# Patient Record
Sex: Female | Born: 1935 | Race: White | Hispanic: No | Smoking: Former smoker
Health system: Southern US, Community
[De-identification: ages and names within clinical notes are randomized; demographics above are authoritative.]

## PROBLEM LIST (undated history)

## (undated) DIAGNOSIS — J189 Pneumonia, unspecified organism: Secondary | ICD-10-CM

## (undated) DIAGNOSIS — F32A Depression, unspecified: Secondary | ICD-10-CM

## (undated) DIAGNOSIS — R011 Cardiac murmur, unspecified: Secondary | ICD-10-CM

## (undated) DIAGNOSIS — I10 Essential (primary) hypertension: Secondary | ICD-10-CM

## (undated) DIAGNOSIS — Z9981 Dependence on supplemental oxygen: Secondary | ICD-10-CM

## (undated) DIAGNOSIS — F329 Major depressive disorder, single episode, unspecified: Secondary | ICD-10-CM

## (undated) DIAGNOSIS — J449 Chronic obstructive pulmonary disease, unspecified: Secondary | ICD-10-CM

## (undated) DIAGNOSIS — M069 Rheumatoid arthritis, unspecified: Secondary | ICD-10-CM

## (undated) HISTORY — PX: DILATION AND CURETTAGE OF UTERUS: SHX78

## (undated) HISTORY — PX: HAMMER TOE SURGERY: SHX385

---
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
## 1958-07-14 HISTORY — PX: EXCISIONAL HEMORRHOIDECTOMY: SHX1541

## 2011-07-22 DIAGNOSIS — Z79899 Other long term (current) drug therapy: Secondary | ICD-10-CM | POA: Diagnosis not present

## 2011-07-22 DIAGNOSIS — M069 Rheumatoid arthritis, unspecified: Secondary | ICD-10-CM | POA: Diagnosis not present

## 2011-07-24 DIAGNOSIS — Z79899 Other long term (current) drug therapy: Secondary | ICD-10-CM | POA: Diagnosis not present

## 2011-07-24 DIAGNOSIS — M0510 Rheumatoid lung disease with rheumatoid arthritis of unspecified site: Secondary | ICD-10-CM | POA: Diagnosis not present

## 2011-07-24 DIAGNOSIS — M069 Rheumatoid arthritis, unspecified: Secondary | ICD-10-CM | POA: Diagnosis not present

## 2011-08-14 DIAGNOSIS — M069 Rheumatoid arthritis, unspecified: Secondary | ICD-10-CM | POA: Diagnosis not present

## 2011-09-24 DIAGNOSIS — M069 Rheumatoid arthritis, unspecified: Secondary | ICD-10-CM | POA: Diagnosis not present

## 2011-11-05 DIAGNOSIS — M069 Rheumatoid arthritis, unspecified: Secondary | ICD-10-CM | POA: Diagnosis not present

## 2011-11-20 DIAGNOSIS — M069 Rheumatoid arthritis, unspecified: Secondary | ICD-10-CM | POA: Diagnosis not present

## 2011-11-20 DIAGNOSIS — Z79899 Other long term (current) drug therapy: Secondary | ICD-10-CM | POA: Diagnosis not present

## 2011-11-20 DIAGNOSIS — M0510 Rheumatoid lung disease with rheumatoid arthritis of unspecified site: Secondary | ICD-10-CM | POA: Diagnosis not present

## 2011-11-24 DIAGNOSIS — Z79899 Other long term (current) drug therapy: Secondary | ICD-10-CM | POA: Diagnosis not present

## 2011-11-24 DIAGNOSIS — M069 Rheumatoid arthritis, unspecified: Secondary | ICD-10-CM | POA: Diagnosis not present

## 2011-12-17 DIAGNOSIS — M069 Rheumatoid arthritis, unspecified: Secondary | ICD-10-CM | POA: Diagnosis not present

## 2012-01-28 DIAGNOSIS — M069 Rheumatoid arthritis, unspecified: Secondary | ICD-10-CM | POA: Diagnosis not present

## 2012-02-05 DIAGNOSIS — G250 Essential tremor: Secondary | ICD-10-CM | POA: Diagnosis not present

## 2012-02-05 DIAGNOSIS — J449 Chronic obstructive pulmonary disease, unspecified: Secondary | ICD-10-CM | POA: Diagnosis not present

## 2012-02-05 DIAGNOSIS — M069 Rheumatoid arthritis, unspecified: Secondary | ICD-10-CM | POA: Diagnosis not present

## 2012-02-05 DIAGNOSIS — G252 Other specified forms of tremor: Secondary | ICD-10-CM | POA: Diagnosis not present

## 2012-02-23 DIAGNOSIS — M069 Rheumatoid arthritis, unspecified: Secondary | ICD-10-CM | POA: Diagnosis not present

## 2012-02-23 DIAGNOSIS — M0510 Rheumatoid lung disease with rheumatoid arthritis of unspecified site: Secondary | ICD-10-CM | POA: Diagnosis not present

## 2012-02-23 DIAGNOSIS — Z79899 Other long term (current) drug therapy: Secondary | ICD-10-CM | POA: Diagnosis not present

## 2012-03-10 DIAGNOSIS — M069 Rheumatoid arthritis, unspecified: Secondary | ICD-10-CM | POA: Diagnosis not present

## 2012-04-08 DIAGNOSIS — R011 Cardiac murmur, unspecified: Secondary | ICD-10-CM | POA: Diagnosis not present

## 2012-04-08 DIAGNOSIS — Z23 Encounter for immunization: Secondary | ICD-10-CM | POA: Diagnosis not present

## 2012-04-08 DIAGNOSIS — M069 Rheumatoid arthritis, unspecified: Secondary | ICD-10-CM | POA: Diagnosis not present

## 2012-04-15 DIAGNOSIS — J449 Chronic obstructive pulmonary disease, unspecified: Secondary | ICD-10-CM | POA: Diagnosis not present

## 2012-04-15 DIAGNOSIS — R5383 Other fatigue: Secondary | ICD-10-CM | POA: Diagnosis not present

## 2012-04-15 DIAGNOSIS — M2550 Pain in unspecified joint: Secondary | ICD-10-CM | POA: Diagnosis not present

## 2012-04-15 DIAGNOSIS — M069 Rheumatoid arthritis, unspecified: Secondary | ICD-10-CM | POA: Diagnosis not present

## 2012-04-15 DIAGNOSIS — R5381 Other malaise: Secondary | ICD-10-CM | POA: Diagnosis not present

## 2012-05-03 DIAGNOSIS — M069 Rheumatoid arthritis, unspecified: Secondary | ICD-10-CM | POA: Diagnosis not present

## 2012-05-05 DIAGNOSIS — M2550 Pain in unspecified joint: Secondary | ICD-10-CM | POA: Diagnosis not present

## 2012-05-05 DIAGNOSIS — Z79899 Other long term (current) drug therapy: Secondary | ICD-10-CM | POA: Diagnosis not present

## 2012-05-05 DIAGNOSIS — M069 Rheumatoid arthritis, unspecified: Secondary | ICD-10-CM | POA: Diagnosis not present

## 2012-05-11 ENCOUNTER — Other Ambulatory Visit (HOSPITAL_COMMUNITY): Payer: Self-pay | Admitting: *Deleted

## 2012-05-12 ENCOUNTER — Encounter (HOSPITAL_COMMUNITY): Payer: Self-pay

## 2012-05-12 ENCOUNTER — Encounter (HOSPITAL_COMMUNITY): Admission: RE | Admit: 2012-05-12 | Payer: Medicare Other | Source: Ambulatory Visit | Admitting: Rheumatology

## 2012-05-12 ENCOUNTER — Encounter (HOSPITAL_COMMUNITY)
Admission: RE | Admit: 2012-05-12 | Discharge: 2012-05-12 | Disposition: A | Discharge status: Home / Self Care | Payer: Medicare Other | Source: Ambulatory Visit | Attending: Rheumatology | Admitting: Rheumatology

## 2012-05-12 DIAGNOSIS — M069 Rheumatoid arthritis, unspecified: Secondary | ICD-10-CM | POA: Diagnosis not present

## 2012-05-12 HISTORY — DX: Cardiac murmur, unspecified: R01.1

## 2012-05-12 HISTORY — DX: Chronic obstructive pulmonary disease, unspecified: J44.9

## 2012-05-12 HISTORY — DX: Rheumatoid arthritis, unspecified: M06.9

## 2012-05-12 HISTORY — DX: Essential (primary) hypertension: I10

## 2012-05-12 MED ORDER — ACETAMINOPHEN 500 MG PO TABS
1000.0000 mg | ORAL_TABLET | ORAL | Status: DC
  Administered 2012-05-12: 500 mg via ORAL
  Filled 2012-05-12: qty 1

## 2012-05-12 MED ORDER — SODIUM CHLORIDE 0.9 % IV SOLN
5.0000 mg/kg | INTRAVENOUS | Status: DC
  Administered 2012-05-12: 300 mg via INTRAVENOUS
  Filled 2012-05-12: qty 30

## 2012-05-12 MED ORDER — SODIUM CHLORIDE 0.9 % IV SOLN
INTRAVENOUS | Status: DC
  Administered 2012-05-12: 12:00:00 via INTRAVENOUS

## 2012-05-12 NOTE — Progress Notes (Signed)
Spoke to [employer] and she stated that patient needs to have a total of 1000 mg of Tylenol prior to transfusion. If she took 500 mg in her Percocet at home it is ok to give another 500mg  when arrives here to have a total of 1000mg  Tylenol pre infusion.

## 2012-05-12 NOTE — Progress Notes (Signed)
Patient took one Percocet at home 1-2 hours ago. She takes 7.5/500 Percocet 1 pill every 5 hours. Order for 1000mg  Tylenol premed prior to Remicade. Call placed to [facility] with Dr. Nickola Major to clarify Tylenol dosage for premed as patient has had 500mg  prior to coming.

## 2012-06-23 ENCOUNTER — Encounter (HOSPITAL_COMMUNITY)
Admission: RE | Admit: 2012-06-23 | Discharge: 2012-06-23 | Disposition: A | Discharge status: Home / Self Care | Payer: Medicare Other | Source: Ambulatory Visit | Attending: Rheumatology | Admitting: Rheumatology

## 2012-06-23 ENCOUNTER — Encounter (HOSPITAL_COMMUNITY): Payer: Self-pay

## 2012-06-23 DIAGNOSIS — M069 Rheumatoid arthritis, unspecified: Secondary | ICD-10-CM | POA: Diagnosis not present

## 2012-06-23 MED ORDER — SODIUM CHLORIDE 0.9 % IV SOLN
Freq: Once | INTRAVENOUS | Status: AC
  Administered 2012-06-23: 250 mL via INTRAVENOUS

## 2012-06-23 MED ORDER — SODIUM CHLORIDE 0.9 % IV SOLN
5.0000 mg/kg | Freq: Once | INTRAVENOUS | Status: AC
  Administered 2012-06-23: 300 mg via INTRAVENOUS
  Filled 2012-06-23: qty 30

## 2012-06-23 NOTE — Progress Notes (Signed)
declines tylenol as she has had 2 percocet this am

## 2012-06-25 DIAGNOSIS — Z79899 Other long term (current) drug therapy: Secondary | ICD-10-CM | POA: Diagnosis not present

## 2012-06-25 DIAGNOSIS — G479 Sleep disorder, unspecified: Secondary | ICD-10-CM | POA: Diagnosis not present

## 2012-06-25 DIAGNOSIS — J449 Chronic obstructive pulmonary disease, unspecified: Secondary | ICD-10-CM | POA: Diagnosis not present

## 2012-06-25 DIAGNOSIS — J329 Chronic sinusitis, unspecified: Secondary | ICD-10-CM | POA: Diagnosis not present

## 2012-06-25 DIAGNOSIS — Z0000 Encounter for general adult medical examination without abnormal findings: Secondary | ICD-10-CM | POA: Diagnosis not present

## 2012-06-25 DIAGNOSIS — I10 Essential (primary) hypertension: Secondary | ICD-10-CM | POA: Diagnosis not present

## 2012-07-15 DIAGNOSIS — M2550 Pain in unspecified joint: Secondary | ICD-10-CM | POA: Diagnosis not present

## 2012-07-15 DIAGNOSIS — M069 Rheumatoid arthritis, unspecified: Secondary | ICD-10-CM | POA: Diagnosis not present

## 2012-07-15 DIAGNOSIS — Z79899 Other long term (current) drug therapy: Secondary | ICD-10-CM | POA: Diagnosis not present

## 2012-08-04 ENCOUNTER — Encounter (HOSPITAL_COMMUNITY): Payer: Self-pay

## 2012-08-04 ENCOUNTER — Encounter (HOSPITAL_COMMUNITY)
Admission: RE | Admit: 2012-08-04 | Discharge: 2012-08-04 | Disposition: A | Discharge status: Home / Self Care | Payer: Medicare Other | Source: Ambulatory Visit | Attending: Rheumatology | Admitting: Rheumatology

## 2012-08-04 DIAGNOSIS — M069 Rheumatoid arthritis, unspecified: Secondary | ICD-10-CM | POA: Diagnosis not present

## 2012-08-04 MED ORDER — SODIUM CHLORIDE 0.9 % IV SOLN
500.0000 mg | INTRAVENOUS | Status: DC
  Administered 2012-08-04: 500 mg via INTRAVENOUS
  Filled 2012-08-04: qty 50

## 2012-08-04 MED ORDER — SODIUM CHLORIDE 0.9 % IV SOLN
INTRAVENOUS | Status: DC
  Administered 2012-08-04: 11:00:00 via INTRAVENOUS

## 2012-08-04 MED ORDER — ACETAMINOPHEN 500 MG PO TABS
1000.0000 mg | ORAL_TABLET | ORAL | Status: DC
  Administered 2012-08-04: 1000 mg via ORAL
  Filled 2012-08-04: qty 2

## 2012-09-15 ENCOUNTER — Encounter (HOSPITAL_COMMUNITY): Payer: Medicare Other

## 2012-09-16 ENCOUNTER — Encounter (HOSPITAL_COMMUNITY): Payer: Self-pay

## 2012-09-16 ENCOUNTER — Encounter (HOSPITAL_COMMUNITY)
Admission: RE | Admit: 2012-09-16 | Discharge: 2012-09-16 | Disposition: A | Discharge status: Home / Self Care | Payer: Medicare Other | Source: Ambulatory Visit | Attending: Rheumatology | Admitting: Rheumatology

## 2012-09-16 DIAGNOSIS — M069 Rheumatoid arthritis, unspecified: Secondary | ICD-10-CM | POA: Insufficient documentation

## 2012-09-16 MED ORDER — ACETAMINOPHEN 500 MG PO TABS
1000.0000 mg | ORAL_TABLET | ORAL | Status: DC
  Administered 2012-09-16: 1000 mg via ORAL
  Filled 2012-09-16: qty 2

## 2012-09-16 MED ORDER — SODIUM CHLORIDE 0.9 % IV SOLN: INTRAVENOUS | Status: DC

## 2012-09-16 MED ORDER — SODIUM CHLORIDE 0.9 % IV SOLN
500.0000 mg | INTRAVENOUS | Status: DC
  Administered 2012-09-16: 500 mg via INTRAVENOUS
  Filled 2012-09-16: qty 50

## 2012-09-27 DIAGNOSIS — M069 Rheumatoid arthritis, unspecified: Secondary | ICD-10-CM | POA: Diagnosis not present

## 2012-09-27 DIAGNOSIS — Z79899 Other long term (current) drug therapy: Secondary | ICD-10-CM | POA: Diagnosis not present

## 2012-09-27 DIAGNOSIS — M2550 Pain in unspecified joint: Secondary | ICD-10-CM | POA: Diagnosis not present

## 2012-10-28 ENCOUNTER — Encounter (HOSPITAL_COMMUNITY)
Admission: RE | Admit: 2012-10-28 | Discharge: 2012-10-28 | Disposition: A | Discharge status: Home / Self Care | Payer: Medicare Other | Source: Ambulatory Visit | Attending: Rheumatology | Admitting: Rheumatology

## 2012-10-28 ENCOUNTER — Encounter (HOSPITAL_COMMUNITY): Payer: Self-pay

## 2012-10-28 DIAGNOSIS — M069 Rheumatoid arthritis, unspecified: Secondary | ICD-10-CM | POA: Insufficient documentation

## 2012-10-28 MED ORDER — ACETAMINOPHEN 500 MG PO TABS
1000.0000 mg | ORAL_TABLET | ORAL | Status: DC
  Administered 2012-10-28: 1000 mg via ORAL
  Filled 2012-10-28: qty 2

## 2012-10-28 MED ORDER — SODIUM CHLORIDE 0.9 % IV SOLN
8.0000 mg/kg | INTRAVENOUS | Status: DC
  Administered 2012-10-28: 500 mg via INTRAVENOUS
  Filled 2012-10-28: qty 50

## 2012-10-28 MED ORDER — SODIUM CHLORIDE 0.9 % IV SOLN
INTRAVENOUS | Status: DC
  Administered 2012-10-28: 20 mL/h via INTRAVENOUS

## 2012-11-19 DIAGNOSIS — J449 Chronic obstructive pulmonary disease, unspecified: Secondary | ICD-10-CM | POA: Diagnosis not present

## 2012-11-19 DIAGNOSIS — R5383 Other fatigue: Secondary | ICD-10-CM | POA: Diagnosis not present

## 2012-11-19 DIAGNOSIS — I10 Essential (primary) hypertension: Secondary | ICD-10-CM | POA: Diagnosis not present

## 2012-11-19 DIAGNOSIS — R011 Cardiac murmur, unspecified: Secondary | ICD-10-CM | POA: Diagnosis not present

## 2012-11-19 DIAGNOSIS — R5381 Other malaise: Secondary | ICD-10-CM | POA: Diagnosis not present

## 2012-12-09 ENCOUNTER — Encounter (HOSPITAL_COMMUNITY): Payer: Medicare Other

## 2012-12-10 DIAGNOSIS — Z0389 Encounter for observation for other suspected diseases and conditions ruled out: Secondary | ICD-10-CM | POA: Diagnosis not present

## 2012-12-10 DIAGNOSIS — R011 Cardiac murmur, unspecified: Secondary | ICD-10-CM | POA: Diagnosis not present

## 2012-12-10 DIAGNOSIS — R5381 Other malaise: Secondary | ICD-10-CM | POA: Diagnosis not present

## 2012-12-10 DIAGNOSIS — R5383 Other fatigue: Secondary | ICD-10-CM | POA: Diagnosis not present

## 2012-12-13 ENCOUNTER — Encounter (HOSPITAL_COMMUNITY): Payer: Self-pay

## 2012-12-13 ENCOUNTER — Encounter (HOSPITAL_COMMUNITY)
Admission: RE | Admit: 2012-12-13 | Discharge: 2012-12-13 | Disposition: A | Discharge status: Home / Self Care | Payer: Medicare Other | Source: Ambulatory Visit | Attending: Rheumatology | Admitting: Rheumatology

## 2012-12-13 DIAGNOSIS — M069 Rheumatoid arthritis, unspecified: Secondary | ICD-10-CM | POA: Diagnosis not present

## 2012-12-13 MED ORDER — ACETAMINOPHEN 500 MG PO TABS
1000.0000 mg | ORAL_TABLET | ORAL | Status: DC
  Administered 2012-12-13: 1000 mg via ORAL
  Filled 2012-12-13: qty 2

## 2012-12-13 MED ORDER — SODIUM CHLORIDE 0.9 % IV SOLN
8.0000 mg/kg | INTRAVENOUS | Status: DC
  Administered 2012-12-13: 500 mg via INTRAVENOUS
  Filled 2012-12-13: qty 50

## 2012-12-13 MED ORDER — SODIUM CHLORIDE 0.9 % IV SOLN
INTRAVENOUS | Status: DC
  Administered 2012-12-13: 11:00:00 via INTRAVENOUS

## 2013-01-03 DIAGNOSIS — Z0389 Encounter for observation for other suspected diseases and conditions ruled out: Secondary | ICD-10-CM | POA: Diagnosis not present

## 2013-01-03 DIAGNOSIS — R011 Cardiac murmur, unspecified: Secondary | ICD-10-CM | POA: Diagnosis not present

## 2013-01-03 DIAGNOSIS — R5381 Other malaise: Secondary | ICD-10-CM | POA: Diagnosis not present

## 2013-01-03 DIAGNOSIS — R5383 Other fatigue: Secondary | ICD-10-CM | POA: Diagnosis not present

## 2013-01-24 ENCOUNTER — Encounter (HOSPITAL_COMMUNITY)
Admission: RE | Admit: 2013-01-24 | Discharge: 2013-01-24 | Disposition: A | Discharge status: Home / Self Care | Payer: Medicare Other | Source: Ambulatory Visit | Attending: Rheumatology | Admitting: Rheumatology

## 2013-01-24 ENCOUNTER — Encounter (HOSPITAL_COMMUNITY): Payer: Self-pay

## 2013-01-24 DIAGNOSIS — M069 Rheumatoid arthritis, unspecified: Secondary | ICD-10-CM | POA: Diagnosis not present

## 2013-01-24 MED ORDER — SODIUM CHLORIDE 0.9 % IV SOLN
INTRAVENOUS | Status: AC
  Administered 2013-01-24: 11:00:00 via INTRAVENOUS

## 2013-01-24 MED ORDER — ACETAMINOPHEN 500 MG PO TABS
1000.0000 mg | ORAL_TABLET | ORAL | Status: AC
  Administered 2013-01-24: 1000 mg via ORAL
  Filled 2013-01-24: qty 2

## 2013-01-24 MED ORDER — SODIUM CHLORIDE 0.9 % IV SOLN
8.0000 mg/kg | INTRAVENOUS | Status: AC
  Administered 2013-01-24: 500 mg via INTRAVENOUS
  Filled 2013-01-24: qty 50

## 2013-03-01 DIAGNOSIS — M069 Rheumatoid arthritis, unspecified: Secondary | ICD-10-CM | POA: Diagnosis not present

## 2013-03-01 DIAGNOSIS — M2550 Pain in unspecified joint: Secondary | ICD-10-CM | POA: Diagnosis not present

## 2013-03-01 DIAGNOSIS — Z79899 Other long term (current) drug therapy: Secondary | ICD-10-CM | POA: Diagnosis not present

## 2013-03-07 ENCOUNTER — Other Ambulatory Visit (HOSPITAL_COMMUNITY): Payer: Self-pay | Admitting: Rheumatology

## 2013-03-07 ENCOUNTER — Ambulatory Visit (HOSPITAL_COMMUNITY)
Admission: RE | Admit: 2013-03-07 | Discharge: 2013-03-07 | Disposition: A | Discharge status: Home / Self Care | Payer: Medicare Other | Source: Ambulatory Visit | Attending: Rheumatology | Admitting: Rheumatology

## 2013-03-07 ENCOUNTER — Encounter (HOSPITAL_COMMUNITY): Payer: Self-pay

## 2013-03-07 DIAGNOSIS — M069 Rheumatoid arthritis, unspecified: Secondary | ICD-10-CM | POA: Diagnosis not present

## 2013-03-07 MED ORDER — SODIUM CHLORIDE 0.9 % IV SOLN
10.0000 mg/kg | INTRAVENOUS | Status: DC
  Administered 2013-03-07: 700 mg via INTRAVENOUS
  Filled 2013-03-07: qty 70

## 2013-03-07 MED ORDER — SODIUM CHLORIDE 0.9 % IV SOLN
INTRAVENOUS | Status: DC
  Administered 2013-03-07: 11:00:00 via INTRAVENOUS

## 2013-04-18 ENCOUNTER — Encounter (HOSPITAL_COMMUNITY)
Admission: RE | Admit: 2013-04-18 | Discharge: 2013-04-18 | Disposition: A | Discharge status: Home / Self Care | Payer: Medicare Other | Source: Ambulatory Visit | Attending: Rheumatology | Admitting: Rheumatology

## 2013-04-18 ENCOUNTER — Other Ambulatory Visit (HOSPITAL_COMMUNITY): Payer: Self-pay | Admitting: Rheumatology

## 2013-04-18 DIAGNOSIS — L089 Local infection of the skin and subcutaneous tissue, unspecified: Secondary | ICD-10-CM | POA: Diagnosis not present

## 2013-04-18 DIAGNOSIS — M069 Rheumatoid arthritis, unspecified: Secondary | ICD-10-CM | POA: Insufficient documentation

## 2013-04-18 DIAGNOSIS — L290 Pruritus ani: Secondary | ICD-10-CM | POA: Diagnosis not present

## 2013-04-18 NOTE — Progress Notes (Signed)
Pt informed of dr Nickola Major orders and pt left without treatment  Apt rescheduled

## 2013-04-18 NOTE — Progress Notes (Signed)
Pt arived to short stay c open weeping wounds rt  Fourth and fifth fingers, dr Nickola Major notified  Order to not infuse today. Reschedule for 1 week if  Wounds have healed  Also inform to to see her md about this.  Pt notified

## 2013-04-25 ENCOUNTER — Encounter (HOSPITAL_COMMUNITY): Payer: Medicare Other

## 2013-04-25 DIAGNOSIS — M24549 Contracture, unspecified hand: Secondary | ICD-10-CM | POA: Diagnosis not present

## 2013-04-25 DIAGNOSIS — L02519 Cutaneous abscess of unspecified hand: Secondary | ICD-10-CM | POA: Diagnosis not present

## 2013-04-25 NOTE — Progress Notes (Signed)
Daughter, Dewayne Hatch called to cancel pt's 04/26/13 appt.   States pt has a hand infection and has been referred to a hand specialist.  Pt still to see Dr. Lendon Colonel also.

## 2013-04-26 ENCOUNTER — Encounter (HOSPITAL_COMMUNITY): Admission: RE | Admit: 2013-04-26 | Payer: Medicare Other | Source: Ambulatory Visit

## 2013-04-26 DIAGNOSIS — T148XXA Other injury of unspecified body region, initial encounter: Secondary | ICD-10-CM | POA: Diagnosis not present

## 2013-05-03 DIAGNOSIS — T148XXA Other injury of unspecified body region, initial encounter: Secondary | ICD-10-CM | POA: Diagnosis not present

## 2013-05-10 ENCOUNTER — Encounter (HOSPITAL_COMMUNITY): Payer: Self-pay

## 2013-05-10 ENCOUNTER — Encounter (HOSPITAL_COMMUNITY)
Admission: RE | Admit: 2013-05-10 | Discharge: 2013-05-10 | Disposition: A | Discharge status: Home / Self Care | Payer: Medicare Other | Source: Ambulatory Visit | Attending: Rheumatology | Admitting: Rheumatology

## 2013-05-10 DIAGNOSIS — M069 Rheumatoid arthritis, unspecified: Secondary | ICD-10-CM | POA: Diagnosis not present

## 2013-05-10 MED ORDER — ACETAMINOPHEN 325 MG PO TABS: 650.0000 mg | ORAL_TABLET | ORAL | Status: DC | PRN

## 2013-05-10 MED ORDER — DIPHENHYDRAMINE HCL 25 MG PO TABS
25.0000 mg | ORAL_TABLET | ORAL | Status: DC | PRN
  Administered 2013-05-10: 25 mg via ORAL
  Filled 2013-05-10 (×2): qty 1

## 2013-05-10 MED ORDER — SODIUM CHLORIDE 0.9 % IV SOLN
10.0000 mg/kg | INTRAVENOUS | Status: DC
  Administered 2013-05-10: 700 mg via INTRAVENOUS
  Filled 2013-05-10: qty 70

## 2013-05-10 MED ORDER — SODIUM CHLORIDE 0.9 % IV SOLN
INTRAVENOUS | Status: DC
  Administered 2013-05-10: 250 mL via INTRAVENOUS

## 2013-05-10 MED ORDER — RANITIDINE HCL 150 MG/10ML PO SYRP: 150.0000 mg | ORAL_SOLUTION | ORAL | Status: DC | PRN

## 2013-06-07 DIAGNOSIS — M2550 Pain in unspecified joint: Secondary | ICD-10-CM | POA: Diagnosis not present

## 2013-06-07 DIAGNOSIS — Z23 Encounter for immunization: Secondary | ICD-10-CM | POA: Diagnosis not present

## 2013-06-07 DIAGNOSIS — B379 Candidiasis, unspecified: Secondary | ICD-10-CM | POA: Diagnosis not present

## 2013-06-07 DIAGNOSIS — Z79899 Other long term (current) drug therapy: Secondary | ICD-10-CM | POA: Diagnosis not present

## 2013-06-07 DIAGNOSIS — M069 Rheumatoid arthritis, unspecified: Secondary | ICD-10-CM | POA: Diagnosis not present

## 2013-06-07 DIAGNOSIS — R300 Dysuria: Secondary | ICD-10-CM | POA: Diagnosis not present

## 2013-06-08 DIAGNOSIS — M069 Rheumatoid arthritis, unspecified: Secondary | ICD-10-CM | POA: Diagnosis not present

## 2013-06-08 DIAGNOSIS — R300 Dysuria: Secondary | ICD-10-CM | POA: Diagnosis not present

## 2013-06-21 ENCOUNTER — Other Ambulatory Visit (HOSPITAL_COMMUNITY): Payer: Self-pay | Admitting: Rheumatology

## 2013-06-21 ENCOUNTER — Encounter (HOSPITAL_COMMUNITY)
Admission: RE | Admit: 2013-06-21 | Discharge: 2013-06-21 | Disposition: A | Discharge status: Home / Self Care | Payer: Medicare Other | Source: Ambulatory Visit | Attending: Rheumatology | Admitting: Rheumatology

## 2013-06-21 DIAGNOSIS — M069 Rheumatoid arthritis, unspecified: Secondary | ICD-10-CM | POA: Insufficient documentation

## 2013-06-27 DIAGNOSIS — B029 Zoster without complications: Secondary | ICD-10-CM | POA: Diagnosis not present

## 2013-06-27 DIAGNOSIS — I10 Essential (primary) hypertension: Secondary | ICD-10-CM | POA: Diagnosis not present

## 2013-07-21 ENCOUNTER — Other Ambulatory Visit: Payer: Self-pay | Admitting: Internal Medicine

## 2013-07-21 ENCOUNTER — Ambulatory Visit
Admission: RE | Admit: 2013-07-21 | Discharge: 2013-07-21 | Disposition: A | Discharge status: Home / Self Care | Payer: Medicare Other | Source: Ambulatory Visit | Attending: Internal Medicine | Admitting: Internal Medicine

## 2013-07-21 DIAGNOSIS — R110 Nausea: Secondary | ICD-10-CM | POA: Diagnosis not present

## 2013-07-21 DIAGNOSIS — M792 Neuralgia and neuritis, unspecified: Secondary | ICD-10-CM

## 2013-07-21 DIAGNOSIS — IMO0002 Reserved for concepts with insufficient information to code with codable children: Secondary | ICD-10-CM | POA: Diagnosis not present

## 2013-07-21 DIAGNOSIS — R072 Precordial pain: Secondary | ICD-10-CM | POA: Diagnosis not present

## 2013-07-25 DIAGNOSIS — M069 Rheumatoid arthritis, unspecified: Secondary | ICD-10-CM | POA: Diagnosis not present

## 2013-09-07 DIAGNOSIS — M069 Rheumatoid arthritis, unspecified: Secondary | ICD-10-CM | POA: Diagnosis not present

## 2013-09-20 DIAGNOSIS — R300 Dysuria: Secondary | ICD-10-CM | POA: Diagnosis not present

## 2013-09-20 DIAGNOSIS — IMO0002 Reserved for concepts with insufficient information to code with codable children: Secondary | ICD-10-CM | POA: Diagnosis not present

## 2013-10-05 DIAGNOSIS — M2550 Pain in unspecified joint: Secondary | ICD-10-CM | POA: Diagnosis not present

## 2013-10-05 DIAGNOSIS — Z79899 Other long term (current) drug therapy: Secondary | ICD-10-CM | POA: Diagnosis not present

## 2013-10-05 DIAGNOSIS — M069 Rheumatoid arthritis, unspecified: Secondary | ICD-10-CM | POA: Diagnosis not present

## 2013-10-05 DIAGNOSIS — B029 Zoster without complications: Secondary | ICD-10-CM | POA: Diagnosis not present

## 2013-10-25 DIAGNOSIS — B029 Zoster without complications: Secondary | ICD-10-CM | POA: Diagnosis not present

## 2013-10-25 DIAGNOSIS — I10 Essential (primary) hypertension: Secondary | ICD-10-CM | POA: Diagnosis not present

## 2013-10-25 DIAGNOSIS — N952 Postmenopausal atrophic vaginitis: Secondary | ICD-10-CM | POA: Diagnosis not present

## 2013-11-01 DIAGNOSIS — M069 Rheumatoid arthritis, unspecified: Secondary | ICD-10-CM | POA: Diagnosis not present

## 2013-12-26 DIAGNOSIS — M069 Rheumatoid arthritis, unspecified: Secondary | ICD-10-CM | POA: Diagnosis not present

## 2014-01-03 DIAGNOSIS — R3915 Urgency of urination: Secondary | ICD-10-CM | POA: Diagnosis not present

## 2014-01-03 DIAGNOSIS — N9089 Other specified noninflammatory disorders of vulva and perineum: Secondary | ICD-10-CM | POA: Diagnosis not present

## 2014-01-23 DIAGNOSIS — Z1331 Encounter for screening for depression: Secondary | ICD-10-CM | POA: Diagnosis not present

## 2014-01-23 DIAGNOSIS — I10 Essential (primary) hypertension: Secondary | ICD-10-CM | POA: Diagnosis not present

## 2014-01-23 DIAGNOSIS — Z23 Encounter for immunization: Secondary | ICD-10-CM | POA: Diagnosis not present

## 2014-02-21 DIAGNOSIS — M069 Rheumatoid arthritis, unspecified: Secondary | ICD-10-CM | POA: Diagnosis not present

## 2014-04-17 DIAGNOSIS — M0589 Other rheumatoid arthritis with rheumatoid factor of multiple sites: Secondary | ICD-10-CM | POA: Diagnosis not present

## 2014-06-12 DIAGNOSIS — M0589 Other rheumatoid arthritis with rheumatoid factor of multiple sites: Secondary | ICD-10-CM | POA: Diagnosis not present

## 2014-06-19 ENCOUNTER — Other Ambulatory Visit: Payer: Self-pay | Admitting: Geriatric Medicine

## 2014-06-19 ENCOUNTER — Ambulatory Visit
Admission: RE | Admit: 2014-06-19 | Discharge: 2014-06-19 | Disposition: A | Discharge status: Home / Self Care | Payer: Medicare Other | Source: Ambulatory Visit | Attending: Geriatric Medicine | Admitting: Geriatric Medicine

## 2014-06-19 DIAGNOSIS — R0781 Pleurodynia: Secondary | ICD-10-CM

## 2014-06-19 DIAGNOSIS — W1809XA Striking against other object with subsequent fall, initial encounter: Secondary | ICD-10-CM | POA: Diagnosis not present

## 2014-06-19 DIAGNOSIS — S299XXA Unspecified injury of thorax, initial encounter: Secondary | ICD-10-CM | POA: Diagnosis not present

## 2014-07-31 DIAGNOSIS — R5383 Other fatigue: Secondary | ICD-10-CM | POA: Diagnosis not present

## 2014-07-31 DIAGNOSIS — F329 Major depressive disorder, single episode, unspecified: Secondary | ICD-10-CM | POA: Diagnosis not present

## 2014-07-31 DIAGNOSIS — I10 Essential (primary) hypertension: Secondary | ICD-10-CM | POA: Diagnosis not present

## 2014-07-31 DIAGNOSIS — M069 Rheumatoid arthritis, unspecified: Secondary | ICD-10-CM | POA: Diagnosis not present

## 2014-07-31 DIAGNOSIS — Z0000 Encounter for general adult medical examination without abnormal findings: Secondary | ICD-10-CM | POA: Diagnosis not present

## 2014-07-31 DIAGNOSIS — L84 Corns and callosities: Secondary | ICD-10-CM | POA: Diagnosis not present

## 2014-08-07 DIAGNOSIS — M0589 Other rheumatoid arthritis with rheumatoid factor of multiple sites: Secondary | ICD-10-CM | POA: Diagnosis not present

## 2014-08-14 ENCOUNTER — Ambulatory Visit: Payer: Self-pay | Admitting: Podiatry

## 2014-08-21 DIAGNOSIS — F329 Major depressive disorder, single episode, unspecified: Secondary | ICD-10-CM | POA: Diagnosis not present

## 2014-08-21 DIAGNOSIS — J309 Allergic rhinitis, unspecified: Secondary | ICD-10-CM | POA: Diagnosis not present

## 2014-08-30 DIAGNOSIS — M0589 Other rheumatoid arthritis with rheumatoid factor of multiple sites: Secondary | ICD-10-CM | POA: Diagnosis not present

## 2014-08-30 DIAGNOSIS — M2550 Pain in unspecified joint: Secondary | ICD-10-CM | POA: Diagnosis not present

## 2014-08-30 DIAGNOSIS — Z79899 Other long term (current) drug therapy: Secondary | ICD-10-CM | POA: Diagnosis not present

## 2014-09-04 ENCOUNTER — Ambulatory Visit: Payer: Medicare Other | Admitting: Podiatry

## 2014-09-21 DIAGNOSIS — M0589 Other rheumatoid arthritis with rheumatoid factor of multiple sites: Secondary | ICD-10-CM | POA: Diagnosis not present

## 2014-09-25 ENCOUNTER — Ambulatory Visit (INDEPENDENT_AMBULATORY_CARE_PROVIDER_SITE_OTHER): Payer: Medicare Other | Admitting: Podiatry

## 2014-09-25 ENCOUNTER — Encounter: Payer: Self-pay | Admitting: Podiatry

## 2014-09-25 VITALS — BP 121/64 | HR 74 | Resp 12

## 2014-09-25 DIAGNOSIS — M06871 Other specified rheumatoid arthritis, right ankle and foot: Secondary | ICD-10-CM

## 2014-09-25 NOTE — Patient Instructions (Signed)
Wear gelcap toe sleeve on third or fourth right toe as needed  Rheumatoid Arthritis Rheumatoid arthritis is a long-term (chronic) inflammatory disease that causes pain, swelling, and stiffness of the joints. It can affect the entire body, including the eyes and lungs. The effects of rheumatoid arthritis vary widely among those with the condition. CAUSES  The cause of rheumatoid arthritis is not known. It tends to run in families and is more common in women. Certain cells of the body's natural defense system (immune system) do not work properly and begin to attack healthy joints. It primarily involves the connective tissue that lines the joints (synovial membrane). This can cause damage to the joint. SYMPTOMS   Pain, stiffness, swelling, and decreased motion of many joints, especially in the hands and feet.  Stiffness that is worse in the morning. It may last 1-2 hours or longer.  Numbness and tingling in the hands.  Fatigue.  Loss of appetite.  Weight loss.  Low-grade fever.  Dry eyes and mouth.  Firm lumps (rheumatoid nodules) that grow beneath the skin in areas such as the elbows and hands. DIAGNOSIS  Diagnosis is based on the symptoms described, an exam, and blood tests. Sometimes, X-rays are helpful. TREATMENT  The goals of treatment are to relieve pain, reduce inflammation, and to slow down or stop joint damage and disability. Methods vary and may include:  Maintaining a balance of rest, exercise, and proper nutrition.  Medicines:  Pain relievers (analgesics).  Corticosteroids and nonsteroidal anti-inflammatory drugs (NSAIDs) to reduce inflammation.  Disease-modifying antirheumatic drugs (DMARDs) to try to slow the course of the disease.  Biologic response modifiers to reduce inflammation and damage.  Physical therapy and occupational therapy.  Surgery for patients with severe joint damage. Joint replacement or fusing of joints may be needed.  Routine monitoring and  ongoing care, such as office visits, blood and urine tests, and X-rays. HOME CARE INSTRUCTIONS   Remain physically active and reduce activity when the disease gets worse.  Eat a well-balanced diet.  Put heat on affected joints when you wake up and before activities. Keep the heat on the affected joint for as long as directed by your health care provider.  Put ice on affected joints following activities or exercising.  Put ice in a plastic bag.  Place a towel between your skin and the bag.  Leave the ice on for 15-20 minutes, 3-4 times per day, or as directed by your health care provider.  Take medicines and supplements only as directed by your health care provider.  Use splints as directed by your health care provider. Splints help maintain joint position and function.  Do not sleep with pillows under your knees. This may lead to spasms.  Participate in a self-management program to keep current with the latest treatment and coping skills. SEEK IMMEDIATE MEDICAL CARE IF:  You have fainting episodes.  You have periods of extreme weakness.  You rapidly develop a hot, painful joint that is more severe than usual joint aches.  You have chills.  You have a fever. FOR MORE INFORMATION   American College of Rheumatology: www.rheumatology.org  Arthritis Foundation: www.arthritis.org Document Released: 06/27/2000 Document Revised: 11/14/2013 Document Reviewed: 08/06/2011 [facility] Patient Information 2015 [place], [place]. This information is not intended to replace advice given to you by your health care provider. Make sure you discuss any questions you have with your health care provider.

## 2014-09-25 NOTE — Progress Notes (Signed)
   Subjective:    Patient ID: Alexis Andrews, female    DOB: Jan 25, 1927, 81 y.o.   MRN: [id number]  HPI  N-DISCOLORATION L-B/L TOENAILS  D-LONG-TERM O-SLOWLY C-LONGER A-N/A T- N/A  ALSO, CHECK RT FOOT 3RD TOE HAVE CORN. The patient's daughter (who is a Engineer, civil (consulting)) self treated corn on third right toe with over-the-counter corn plaster and resolved the corn  Patient is history of rheumatoid arthritis  Review of Systems  Musculoskeletal: Positive for gait problem.  All other systems reviewed and are negative.      Objective:   Physical Exam  Orientated 3 patient presents with daughter present in room  Vascular: DP pulses 2/4 bilaterally PT pulses 2/4 bilaterally  Neurological: Sensation to 10 g monofilament wire intact 5/5 bilaterally Vibratory sensation nonreactive left and reactive right Ankle reflex equal reactive bilaterally  Dermatological: Atrophic skin without any hair growth bilaterally Well-healed surgical scars dorsal medial first MPJ, second, third toes MPJs Nails are neatly trimmed without any deformity and occasional color changes  Musculoskeletal: Limited range of motion right first MPJ Limited range of motion 1-4 left MPJ Transverse medial plain positioning toes 2-4 bilaterally There is no restriction ankle or subtalar or midtarsal joints bilaterally        Assessment & Plan:   Assessment: Satisfactory vascular status bilaterally Mild peripheral neuropathy bilaterally Surgical treatment forrheumatoid arthritis, right foot Rheumatoid changes forefoot left (no history of surgical treatment left foot) Friction rub against the third right toe with a history of corn The toenails are non-dystrophic and need no treatment  Plan: Reviewed results of examination today with patient and daughter Advised against the use of corn medicine on the inner surface of the toes Dispensed gelcap to wear over third right toe as needed  Reappoint at patient's  request

## 2014-10-17 DIAGNOSIS — M0589 Other rheumatoid arthritis with rheumatoid factor of multiple sites: Secondary | ICD-10-CM | POA: Diagnosis not present

## 2014-11-08 DIAGNOSIS — J329 Chronic sinusitis, unspecified: Secondary | ICD-10-CM | POA: Diagnosis not present

## 2014-11-29 DIAGNOSIS — R0781 Pleurodynia: Secondary | ICD-10-CM | POA: Diagnosis not present

## 2014-11-29 DIAGNOSIS — M25532 Pain in left wrist: Secondary | ICD-10-CM | POA: Diagnosis not present

## 2014-11-29 DIAGNOSIS — M2550 Pain in unspecified joint: Secondary | ICD-10-CM | POA: Diagnosis not present

## 2014-11-29 DIAGNOSIS — M0589 Other rheumatoid arthritis with rheumatoid factor of multiple sites: Secondary | ICD-10-CM | POA: Diagnosis not present

## 2014-11-29 DIAGNOSIS — J329 Chronic sinusitis, unspecified: Secondary | ICD-10-CM | POA: Diagnosis not present

## 2014-11-29 DIAGNOSIS — W19XXXA Unspecified fall, initial encounter: Secondary | ICD-10-CM | POA: Diagnosis not present

## 2014-11-29 DIAGNOSIS — S6992XA Unspecified injury of left wrist, hand and finger(s), initial encounter: Secondary | ICD-10-CM | POA: Diagnosis not present

## 2014-11-29 DIAGNOSIS — S299XXA Unspecified injury of thorax, initial encounter: Secondary | ICD-10-CM | POA: Diagnosis not present

## 2014-11-30 DIAGNOSIS — S6392XA Sprain of unspecified part of left wrist and hand, initial encounter: Secondary | ICD-10-CM | POA: Diagnosis not present

## 2014-12-12 DIAGNOSIS — M0589 Other rheumatoid arthritis with rheumatoid factor of multiple sites: Secondary | ICD-10-CM | POA: Diagnosis not present

## 2014-12-12 DIAGNOSIS — M2550 Pain in unspecified joint: Secondary | ICD-10-CM | POA: Diagnosis not present

## 2015-02-05 DIAGNOSIS — M0589 Other rheumatoid arthritis with rheumatoid factor of multiple sites: Secondary | ICD-10-CM | POA: Diagnosis not present

## 2015-03-21 DIAGNOSIS — M0579 Rheumatoid arthritis with rheumatoid factor of multiple sites without organ or systems involvement: Secondary | ICD-10-CM | POA: Diagnosis not present

## 2015-03-21 DIAGNOSIS — Z79899 Other long term (current) drug therapy: Secondary | ICD-10-CM | POA: Diagnosis not present

## 2015-03-21 DIAGNOSIS — J3489 Other specified disorders of nose and nasal sinuses: Secondary | ICD-10-CM | POA: Diagnosis not present

## 2015-03-21 DIAGNOSIS — M2550 Pain in unspecified joint: Secondary | ICD-10-CM | POA: Diagnosis not present

## 2015-04-06 DIAGNOSIS — I10 Essential (primary) hypertension: Secondary | ICD-10-CM | POA: Diagnosis not present

## 2015-04-06 DIAGNOSIS — Z23 Encounter for immunization: Secondary | ICD-10-CM | POA: Diagnosis not present

## 2015-04-06 DIAGNOSIS — R413 Other amnesia: Secondary | ICD-10-CM | POA: Diagnosis not present

## 2015-04-06 DIAGNOSIS — M069 Rheumatoid arthritis, unspecified: Secondary | ICD-10-CM | POA: Diagnosis not present

## 2015-04-06 DIAGNOSIS — F325 Major depressive disorder, single episode, in full remission: Secondary | ICD-10-CM | POA: Diagnosis not present

## 2015-04-09 DIAGNOSIS — J310 Chronic rhinitis: Secondary | ICD-10-CM | POA: Diagnosis not present

## 2015-04-09 DIAGNOSIS — J387 Other diseases of larynx: Secondary | ICD-10-CM | POA: Diagnosis not present

## 2015-04-11 DIAGNOSIS — M0589 Other rheumatoid arthritis with rheumatoid factor of multiple sites: Secondary | ICD-10-CM | POA: Diagnosis not present

## 2015-06-06 DIAGNOSIS — M0589 Other rheumatoid arthritis with rheumatoid factor of multiple sites: Secondary | ICD-10-CM | POA: Diagnosis not present

## 2015-07-18 DIAGNOSIS — Z79899 Other long term (current) drug therapy: Secondary | ICD-10-CM | POA: Diagnosis not present

## 2015-07-18 DIAGNOSIS — M0579 Rheumatoid arthritis with rheumatoid factor of multiple sites without organ or systems involvement: Secondary | ICD-10-CM | POA: Diagnosis not present

## 2015-07-18 DIAGNOSIS — M2550 Pain in unspecified joint: Secondary | ICD-10-CM | POA: Diagnosis not present

## 2015-08-01 DIAGNOSIS — M0589 Other rheumatoid arthritis with rheumatoid factor of multiple sites: Secondary | ICD-10-CM | POA: Diagnosis not present

## 2015-10-01 DIAGNOSIS — I10 Essential (primary) hypertension: Secondary | ICD-10-CM | POA: Diagnosis not present

## 2015-10-01 DIAGNOSIS — M069 Rheumatoid arthritis, unspecified: Secondary | ICD-10-CM | POA: Diagnosis not present

## 2015-10-01 DIAGNOSIS — J449 Chronic obstructive pulmonary disease, unspecified: Secondary | ICD-10-CM | POA: Diagnosis not present

## 2015-10-02 DIAGNOSIS — M0589 Other rheumatoid arthritis with rheumatoid factor of multiple sites: Secondary | ICD-10-CM | POA: Diagnosis not present

## 2015-10-02 DIAGNOSIS — Z79899 Other long term (current) drug therapy: Secondary | ICD-10-CM | POA: Diagnosis not present

## 2015-10-04 DIAGNOSIS — M0579 Rheumatoid arthritis with rheumatoid factor of multiple sites without organ or systems involvement: Secondary | ICD-10-CM | POA: Diagnosis not present

## 2015-11-27 DIAGNOSIS — M0589 Other rheumatoid arthritis with rheumatoid factor of multiple sites: Secondary | ICD-10-CM | POA: Diagnosis not present

## 2016-01-22 DIAGNOSIS — M2550 Pain in unspecified joint: Secondary | ICD-10-CM | POA: Diagnosis not present

## 2016-01-22 DIAGNOSIS — M0579 Rheumatoid arthritis with rheumatoid factor of multiple sites without organ or systems involvement: Secondary | ICD-10-CM | POA: Diagnosis not present

## 2016-01-22 DIAGNOSIS — Z79899 Other long term (current) drug therapy: Secondary | ICD-10-CM | POA: Diagnosis not present

## 2016-01-23 DIAGNOSIS — Z79899 Other long term (current) drug therapy: Secondary | ICD-10-CM | POA: Diagnosis not present

## 2016-01-23 DIAGNOSIS — M0589 Other rheumatoid arthritis with rheumatoid factor of multiple sites: Secondary | ICD-10-CM | POA: Diagnosis not present

## 2016-02-28 ENCOUNTER — Ambulatory Visit
Admission: RE | Admit: 2016-02-28 | Discharge: 2016-02-28 | Disposition: A | Discharge status: Home / Self Care | Payer: Medicare Other | Source: Ambulatory Visit | Attending: Geriatric Medicine | Admitting: Geriatric Medicine

## 2016-02-28 ENCOUNTER — Other Ambulatory Visit: Payer: Self-pay | Admitting: Geriatric Medicine

## 2016-02-28 DIAGNOSIS — R269 Unspecified abnormalities of gait and mobility: Secondary | ICD-10-CM | POA: Diagnosis not present

## 2016-02-28 DIAGNOSIS — R531 Weakness: Secondary | ICD-10-CM | POA: Diagnosis not present

## 2016-02-28 DIAGNOSIS — I10 Essential (primary) hypertension: Secondary | ICD-10-CM | POA: Diagnosis not present

## 2016-02-28 DIAGNOSIS — R350 Frequency of micturition: Secondary | ICD-10-CM | POA: Diagnosis not present

## 2016-02-28 DIAGNOSIS — J449 Chronic obstructive pulmonary disease, unspecified: Secondary | ICD-10-CM | POA: Diagnosis not present

## 2016-02-28 DIAGNOSIS — Z79899 Other long term (current) drug therapy: Secondary | ICD-10-CM | POA: Diagnosis not present

## 2016-03-09 DIAGNOSIS — R069 Unspecified abnormalities of breathing: Secondary | ICD-10-CM | POA: Diagnosis not present

## 2016-03-10 ENCOUNTER — Inpatient Hospital Stay (HOSPITAL_COMMUNITY)
Admission: EM | Admit: 2016-03-10 | Discharge: 2016-03-12 | DRG: 190 | Disposition: A | Discharge status: Home Health | Payer: Medicare Other | Attending: Internal Medicine | Admitting: Internal Medicine

## 2016-03-10 ENCOUNTER — Encounter (HOSPITAL_COMMUNITY): Payer: Self-pay | Admitting: Emergency Medicine

## 2016-03-10 ENCOUNTER — Emergency Department (HOSPITAL_COMMUNITY): Payer: Medicare Other

## 2016-03-10 ENCOUNTER — Other Ambulatory Visit: Payer: Self-pay

## 2016-03-10 DIAGNOSIS — J441 Chronic obstructive pulmonary disease with (acute) exacerbation: Secondary | ICD-10-CM | POA: Diagnosis not present

## 2016-03-10 DIAGNOSIS — J189 Pneumonia, unspecified organism: Secondary | ICD-10-CM | POA: Diagnosis present

## 2016-03-10 DIAGNOSIS — R0602 Shortness of breath: Secondary | ICD-10-CM | POA: Diagnosis not present

## 2016-03-10 DIAGNOSIS — N390 Urinary tract infection, site not specified: Secondary | ICD-10-CM | POA: Diagnosis not present

## 2016-03-10 DIAGNOSIS — E871 Hypo-osmolality and hyponatremia: Secondary | ICD-10-CM | POA: Diagnosis present

## 2016-03-10 DIAGNOSIS — M069 Rheumatoid arthritis, unspecified: Secondary | ICD-10-CM | POA: Diagnosis present

## 2016-03-10 DIAGNOSIS — R7989 Other specified abnormal findings of blood chemistry: Secondary | ICD-10-CM | POA: Diagnosis not present

## 2016-03-10 DIAGNOSIS — R339 Retention of urine, unspecified: Secondary | ICD-10-CM | POA: Diagnosis present

## 2016-03-10 DIAGNOSIS — I10 Essential (primary) hypertension: Secondary | ICD-10-CM | POA: Diagnosis present

## 2016-03-10 DIAGNOSIS — J440 Chronic obstructive pulmonary disease with acute lower respiratory infection: Principal | ICD-10-CM | POA: Diagnosis present

## 2016-03-10 DIAGNOSIS — R6510 Systemic inflammatory response syndrome (SIRS) of non-infectious origin without acute organ dysfunction: Secondary | ICD-10-CM | POA: Diagnosis not present

## 2016-03-10 DIAGNOSIS — I272 Other secondary pulmonary hypertension: Secondary | ICD-10-CM | POA: Diagnosis present

## 2016-03-10 DIAGNOSIS — D649 Anemia, unspecified: Secondary | ICD-10-CM | POA: Diagnosis present

## 2016-03-10 DIAGNOSIS — R531 Weakness: Secondary | ICD-10-CM | POA: Diagnosis not present

## 2016-03-10 DIAGNOSIS — Z87891 Personal history of nicotine dependence: Secondary | ICD-10-CM | POA: Diagnosis not present

## 2016-03-10 DIAGNOSIS — I2489 Other forms of acute ischemic heart disease: Secondary | ICD-10-CM

## 2016-03-10 DIAGNOSIS — Z7982 Long term (current) use of aspirin: Secondary | ICD-10-CM

## 2016-03-10 DIAGNOSIS — J9621 Acute and chronic respiratory failure with hypoxia: Secondary | ICD-10-CM | POA: Diagnosis present

## 2016-03-10 DIAGNOSIS — I248 Other forms of acute ischemic heart disease: Secondary | ICD-10-CM | POA: Diagnosis not present

## 2016-03-10 DIAGNOSIS — I350 Nonrheumatic aortic (valve) stenosis: Secondary | ICD-10-CM | POA: Diagnosis not present

## 2016-03-10 DIAGNOSIS — A419 Sepsis, unspecified organism: Secondary | ICD-10-CM

## 2016-03-10 DIAGNOSIS — R778 Other specified abnormalities of plasma proteins: Secondary | ICD-10-CM | POA: Diagnosis present

## 2016-03-10 HISTORY — DX: Dependence on supplemental oxygen: Z99.81

## 2016-03-10 HISTORY — DX: Pneumonia, unspecified organism: J18.9

## 2016-03-10 HISTORY — DX: Major depressive disorder, single episode, unspecified: F32.9

## 2016-03-10 HISTORY — DX: Depression, unspecified: F32.A

## 2016-03-10 LAB — RESPIRATORY PANEL BY PCR
ADENOVIRUS-RVPPCR: NOT DETECTED
Bordetella pertussis: NOT DETECTED
CHLAMYDOPHILA PNEUMONIAE-RVPPCR: NOT DETECTED
CORONAVIRUS HKU1-RVPPCR: NOT DETECTED
CORONAVIRUS NL63-RVPPCR: NOT DETECTED
CORONAVIRUS OC43-RVPPCR: NOT DETECTED
Coronavirus 229E: NOT DETECTED
Influenza A: NOT DETECTED
Influenza B: NOT DETECTED
METAPNEUMOVIRUS-RVPPCR: NOT DETECTED
Mycoplasma pneumoniae: NOT DETECTED
PARAINFLUENZA VIRUS 1-RVPPCR: NOT DETECTED
PARAINFLUENZA VIRUS 2-RVPPCR: NOT DETECTED
PARAINFLUENZA VIRUS 3-RVPPCR: NOT DETECTED
Parainfluenza Virus 4: NOT DETECTED
RHINOVIRUS / ENTEROVIRUS - RVPPCR: NOT DETECTED
Respiratory Syncytial Virus: NOT DETECTED

## 2016-03-10 LAB — COMPREHENSIVE METABOLIC PANEL
ALBUMIN: 2.8 g/dL — AB (ref 3.5–5.0)
ALT: 10 U/L — ABNORMAL LOW (ref 14–54)
ANION GAP: 10 (ref 5–15)
AST: 24 U/L (ref 15–41)
Alkaline Phosphatase: 57 U/L (ref 38–126)
BUN: 11 mg/dL (ref 6–20)
CO2: 28 mmol/L (ref 22–32)
Calcium: 8.8 mg/dL — ABNORMAL LOW (ref 8.9–10.3)
Chloride: 92 mmol/L — ABNORMAL LOW (ref 101–111)
Creatinine, Ser: 1.01 mg/dL — ABNORMAL HIGH (ref 0.44–1.00)
GFR calc non Af Amer: 49 mL/min — ABNORMAL LOW (ref >60)
GFR, EST AFRICAN AMERICAN: 56 mL/min — AB (ref >60)
GLUCOSE: 179 mg/dL — AB (ref 65–99)
POTASSIUM: 3.5 mmol/L (ref 3.5–5.1)
SODIUM: 130 mmol/L — AB (ref 135–145)
TOTAL PROTEIN: 7.6 g/dL (ref 6.5–8.1)
Total Bilirubin: 0.3 mg/dL (ref 0.3–1.2)

## 2016-03-10 LAB — CBC WITH DIFFERENTIAL/PLATELET
BASOS ABS: 0 10*3/uL (ref 0.0–0.1)
Basophils Relative: 0 %
Eosinophils Absolute: 0 10*3/uL (ref 0.0–0.7)
Eosinophils Relative: 0 %
HEMATOCRIT: 35.3 % — AB (ref 36.0–46.0)
HEMOGLOBIN: 10.9 g/dL — AB (ref 12.0–15.0)
LYMPHS PCT: 7 %
Lymphs Abs: 0.9 10*3/uL (ref 0.7–4.0)
MCH: 30.5 pg (ref 26.0–34.0)
MCHC: 30.9 g/dL (ref 30.0–36.0)
MCV: 98.9 fL (ref 78.0–100.0)
MONO ABS: 0.5 10*3/uL (ref 0.1–1.0)
MONOS PCT: 4 %
NEUTROS ABS: 11.5 10*3/uL — AB (ref 1.7–7.7)
NEUTROS PCT: 89 %
PLATELETS: 323 10*3/uL (ref 150–400)
RBC: 3.57 MIL/uL — ABNORMAL LOW (ref 3.87–5.11)
RDW: 12.3 % (ref 11.5–15.5)
WBC: 13 10*3/uL — ABNORMAL HIGH (ref 4.0–10.5)

## 2016-03-10 LAB — I-STAT TROPONIN, ED: TROPONIN I, POC: 0.39 ng/mL — AB (ref 0.00–0.08)

## 2016-03-10 LAB — TROPONIN I
TROPONIN I: 0.3 ng/mL — AB (ref <0.03)
Troponin I: 0.41 ng/mL (ref <0.03)
Troponin I: 0.47 ng/mL (ref <0.03)

## 2016-03-10 LAB — STREP PNEUMONIAE URINARY ANTIGEN: Strep Pneumo Urinary Antigen: NEGATIVE

## 2016-03-10 LAB — APTT: aPTT: 35 seconds (ref 24–36)

## 2016-03-10 LAB — OSMOLALITY, URINE: Osmolality, Ur: 368 mOsm/kg (ref 300–900)

## 2016-03-10 LAB — LIPID PANEL
Cholesterol: 187 mg/dL (ref 0–200)
HDL: 52 mg/dL (ref >40)
LDL Cholesterol: 118 mg/dL — ABNORMAL HIGH (ref 0–99)
TRIGLYCERIDES: 86 mg/dL (ref <150)
Total CHOL/HDL Ratio: 3.6 RATIO
VLDL: 17 mg/dL (ref 0–40)

## 2016-03-10 LAB — URINALYSIS, ROUTINE W REFLEX MICROSCOPIC
BILIRUBIN URINE: NEGATIVE
Glucose, UA: NEGATIVE mg/dL
Hgb urine dipstick: NEGATIVE
KETONES UR: NEGATIVE mg/dL
Leukocytes, UA: NEGATIVE
NITRITE: NEGATIVE
Protein, ur: NEGATIVE mg/dL
Specific Gravity, Urine: 1.014 (ref 1.005–1.030)
pH: 6 (ref 5.0–8.0)

## 2016-03-10 LAB — LACTIC ACID, PLASMA: LACTIC ACID, VENOUS: 3 mmol/L — AB (ref 0.5–1.9)

## 2016-03-10 LAB — BRAIN NATRIURETIC PEPTIDE: B Natriuretic Peptide: 196 pg/mL — ABNORMAL HIGH (ref 0.0–100.0)

## 2016-03-10 LAB — TSH: TSH: 2.126 u[IU]/mL (ref 0.350–4.500)

## 2016-03-10 LAB — PROTIME-INR
INR: 1.07
Prothrombin Time: 13.9 seconds (ref 11.4–15.2)

## 2016-03-10 LAB — PROCALCITONIN: Procalcitonin: 1.43 ng/mL

## 2016-03-10 LAB — SODIUM, URINE, RANDOM: SODIUM UR: 42 mmol/L

## 2016-03-10 LAB — OSMOLALITY: OSMOLALITY: 277 mosm/kg (ref 275–295)

## 2016-03-10 MED ORDER — LEVOFLOXACIN IN D5W 500 MG/100ML IV SOLN: 500.0000 mg | INTRAVENOUS | Status: DC

## 2016-03-10 MED ORDER — DM-GUAIFENESIN ER 30-600 MG PO TB12: 1.0000 | ORAL_TABLET | Freq: Two times a day (BID) | ORAL | Status: DC | PRN

## 2016-03-10 MED ORDER — METHYLPREDNISOLONE SODIUM SUCC 125 MG IJ SOLR
60.0000 mg | Freq: Two times a day (BID) | INTRAMUSCULAR | Status: DC
Start: 2016-03-10 — End: 2016-03-10
  Administered 2016-03-10: 60 mg via INTRAVENOUS
  Filled 2016-03-10: qty 2

## 2016-03-10 MED ORDER — SODIUM CHLORIDE 0.9 % IV BOLUS (SEPSIS): 1000.0000 mL | Freq: Once | INTRAVENOUS | Status: DC

## 2016-03-10 MED ORDER — ALBUTEROL SULFATE (2.5 MG/3ML) 0.083% IN NEBU: 2.5000 mg | INHALATION_SOLUTION | RESPIRATORY_TRACT | Status: DC | PRN

## 2016-03-10 MED ORDER — LEVOFLOXACIN IN D5W 500 MG/100ML IV SOLN
500.0000 mg | INTRAVENOUS | Status: DC
  Administered 2016-03-11 – 2016-03-12 (×2): 500 mg via INTRAVENOUS
  Filled 2016-03-10 (×2): qty 100

## 2016-03-10 MED ORDER — ATENOLOL 25 MG PO TABS
25.0000 mg | ORAL_TABLET | Freq: Every day | ORAL | Status: DC
  Administered 2016-03-10 – 2016-03-12 (×3): 25 mg via ORAL
  Filled 2016-03-10 (×3): qty 1

## 2016-03-10 MED ORDER — ENOXAPARIN SODIUM 40 MG/0.4ML ~~LOC~~ SOLN
40.0000 mg | SUBCUTANEOUS | Status: DC
  Administered 2016-03-10 – 2016-03-11 (×2): 40 mg via SUBCUTANEOUS
  Filled 2016-03-10 (×3): qty 0.4

## 2016-03-10 MED ORDER — LORATADINE 10 MG PO TABS
10.0000 mg | ORAL_TABLET | Freq: Every day | ORAL | Status: DC
  Administered 2016-03-10 – 2016-03-12 (×3): 10 mg via ORAL
  Filled 2016-03-10 (×3): qty 1

## 2016-03-10 MED ORDER — ASPIRIN EC 81 MG PO TBEC
81.0000 mg | DELAYED_RELEASE_TABLET | Freq: Every day | ORAL | Status: DC
  Administered 2016-03-10 – 2016-03-12 (×3): 81 mg via ORAL
  Filled 2016-03-10 (×3): qty 1

## 2016-03-10 MED ORDER — SODIUM CHLORIDE 0.9 % IV BOLUS (SEPSIS)
1000.0000 mL | Freq: Once | INTRAVENOUS | Status: AC
  Administered 2016-03-10: 1000 mL via INTRAVENOUS

## 2016-03-10 MED ORDER — ASPIRIN 81 MG PO CHEW
324.0000 mg | CHEWABLE_TABLET | Freq: Once | ORAL | Status: AC
  Administered 2016-03-10: 324 mg via ORAL
  Filled 2016-03-10: qty 4

## 2016-03-10 MED ORDER — LEVOFLOXACIN IN D5W 500 MG/100ML IV SOLN
500.0000 mg | Freq: Once | INTRAVENOUS | Status: AC
  Administered 2016-03-10: 500 mg via INTRAVENOUS
  Filled 2016-03-10: qty 100

## 2016-03-10 MED ORDER — METHYLPREDNISOLONE SODIUM SUCC 40 MG IJ SOLR
40.0000 mg | Freq: Two times a day (BID) | INTRAMUSCULAR | Status: DC
  Administered 2016-03-11 – 2016-03-12 (×3): 40 mg via INTRAVENOUS
  Filled 2016-03-10 (×3): qty 1

## 2016-03-10 MED ORDER — SODIUM CHLORIDE 0.9 % IV SOLN
INTRAVENOUS | Status: DC
  Administered 2016-03-10: 06:00:00 via INTRAVENOUS

## 2016-03-10 MED ORDER — NITROGLYCERIN 0.4 MG SL SUBL: 0.4000 mg | SUBLINGUAL_TABLET | SUBLINGUAL | Status: DC | PRN

## 2016-03-10 MED ORDER — IPRATROPIUM-ALBUTEROL 0.5-2.5 (3) MG/3ML IN SOLN
3.0000 mL | RESPIRATORY_TRACT | Status: DC
  Administered 2016-03-10: 3 mL via RESPIRATORY_TRACT
  Filled 2016-03-10: qty 3

## 2016-03-10 MED ORDER — IPRATROPIUM-ALBUTEROL 0.5-2.5 (3) MG/3ML IN SOLN
3.0000 mL | Freq: Four times a day (QID) | RESPIRATORY_TRACT | Status: DC
  Administered 2016-03-10 – 2016-03-11 (×3): 3 mL via RESPIRATORY_TRACT
  Filled 2016-03-10 (×3): qty 3

## 2016-03-10 NOTE — Progress Notes (Signed)
Nutrition Brief Note  Patient identified on the Malnutrition Screening Tool (MST) Report  Wt Readings from Last 15 Encounters:  03/10/16 138 lb 1.6 oz (62.6 kg)  05/10/13 148 lb 8 oz (67.4 kg)  03/07/13 148 lb 8 oz (67.4 kg)  01/24/13 148 lb 6 oz (67.3 kg)  12/13/12 147 lb 4 oz (66.8 kg)  10/28/12 139 lb 8 oz (63.3 kg)  09/16/12 136 lb 2 oz (61.7 kg)  08/04/12 136 lb (61.7 kg)  06/23/12 134 lb 6 oz (61 kg)  05/12/12 138 lb 9.6 oz (62.9 kg)    Body mass index is 25.67 kg/m. Patient meets criteria for Overweight based on current BMI.   Current diet order is Heart Healthy. Patient was eating first meal at time of visit. She reports having a good appetite and eating well PTA. Pt's daughter at bedside is a Engineer, civil (consulting) and reports that patient usually eats very well with PO varying from 2-3 meals per day. Pt gets protein at every meal and enjoys desserts. Per daughter, pt has been maintaining weight at 138 lbs recently. Pt and family deny any questions or concerns at this time. Labs and medications reviewed.   No nutrition interventions warranted at this time. Will monitor PO intake for adequacy. If nutrition issues arise, please re-consult RD.   Dorothea Ogle RD, LDN, CSP Inpatient Clinical Dietitian Pager: [phone] After Hours Pager: [phone]

## 2016-03-10 NOTE — Progress Notes (Addendum)
PROGRESS NOTE        PATIENT DETAILS Name: Alexis Andrews Age: 82 y.o. Sex: female Date of Birth: 1926-09-01 Admit Date: 03/10/2016 Admitting Physician Lorretta Harp, MD PYP:[id number],IWP Maisie Fus, MD  Brief Narrative: Patient is a 82 y.o. female with history of COPD-recently started on O2, history of rheumatoid arthritis on Golimumab injection presented with probable COPD exacerbation and atypical pneumonia. Improving with supportive measures.  Subjective: Lying comfortably in bed-continues to cough but mostly dry. Shortness of breath has significantly improved per daughter at bedside.  Assessment/Plan: Principal Problem: COPD exacerbation: Improving with intravenous Solu-Medrol (will taper), bronchodilators and empiric levofloxacin. Probably provoked by atypical pneumonia. She is moving air well, some scattered rhonchi heard-she does have some coarse breath sounds. Continue current measures, we will continue to follow closely.  Active Problems: SIRS secondary to Community-acquired pneumonia: X-ray findings highly suggestive of atypical pneumonitis, continue levofloxacin. Respiratory virus panel negative, urine streptococcal antigen negative, Legionella antigen pending. Blood cultures pending. She is clinically improved, I do not think she has sepsis at this time-suspect elevated lactic acid likely from hypoxia rather than sepsis.  Elevated troponin: No chest pain-trend is flat-not consistent with ACS. EKG also benign-appearing. Could be demand ischemia secondary to above, will check an echo. Follow for now.  Hyponatremia: Appears very mild, will repeat a.m.  Hypertension: Controlled, continue atenolol  History of rheumatoid arthritis: Currently stable-resume Golimumab injections when she has recovered from pneumonia  Deconditioning/generalized weakness: Likely secondary to acute illness, she recently also had a UTI. PT evaluation  DVT Prophylaxis: Prophylactic  Lovenox   Code Status: DNI  Family Communication: Daughter at bedside  Disposition Plan: Remain inpatient-but will plan on Home health vs SNF on discharge  Antimicrobial agents: IV Levaquin 8/28>>  Procedures: None  CONSULTS:  None  Time spent: 25 minutes-Greater than 50% of this time was spent in counseling, explanation of diagnosis, planning of further management, and coordination of care.  MEDICATIONS: Anti-infectives    Start     Dose/Rate Route Frequency Ordered Stop   03/11/16 0600  levofloxacin (LEVAQUIN) IVPB 500 mg  Status:  Discontinued     500 mg 100 mL/hr over 60 Minutes Intravenous Every 24 hours 03/10/16 0403 03/10/16 0947   03/11/16 0300  levofloxacin (LEVAQUIN) IVPB 500 mg     500 mg 100 mL/hr over 60 Minutes Intravenous Every 24 hours 03/10/16 0947     03/10/16 0230  levofloxacin (LEVAQUIN) IVPB 500 mg     500 mg 100 mL/hr over 60 Minutes Intravenous  Once 03/10/16 0213 03/10/16 0402      Scheduled Meds: . aspirin EC  81 mg Oral Daily  . atenolol  25 mg Oral Daily  . enoxaparin (LOVENOX) injection  40 mg Subcutaneous Q24H  . ipratropium-albuterol  3 mL Nebulization Q6H  . [START ON 03/11/2016] levofloxacin (LEVAQUIN) IV  500 mg Intravenous Q24H  . loratadine  10 mg Oral Daily  . methylPREDNISolone (SOLU-MEDROL) injection  40 mg Intravenous Q12H   Continuous Infusions: . sodium chloride Stopped (03/10/16 1052)   PRN Meds:.albuterol, dextromethorphan-guaiFENesin, nitroGLYCERIN   PHYSICAL EXAM: Vital signs: Vitals:   03/10/16 0512 03/10/16 0624 03/10/16 0823 03/10/16 0934  BP: (!) 110/49 (!) 105/42 (!) 101/44   Pulse: 86 79 77   Resp:   18   Temp: 99 F (37.2 C)  99.6 F (37.6 C)   TempSrc: Oral  Oral   SpO2: 98% 97% 99% 99%  Weight: 62.6 kg (138 lb 1.6 oz)     Height:       Filed Weights   03/10/16 0043 03/10/16 0512  Weight: 67.1 kg (148 lb) 62.6 kg (138 lb 1.6 oz)   Body mass index is 25.67 kg/m.   Gen Exam: Awake and alert  with clear speech. Not in any distress -But looks chronically ill appearing Neck: Supple, No JVD.  Chest: Coarse breath sounds, expiratory rhonchi scattered all over CVS: S1 S2 Regular, no murmurs.  Abdomen: soft, BS +, non tender, non distended.  Extremities: no edema, lower extremities warm to touch. Neurologic: Non Focal.   Skin: No Rash or lesions   Wounds: N/A.    I have personally reviewed following labs and imaging studies  LABORATORY DATA: CBC:  Recent Labs Lab 03/10/16 0148  WBC 13.0*  NEUTROABS 11.5*  HGB 10.9*  HCT 35.3*  MCV 98.9  PLT 323    Basic Metabolic Panel:  Recent Labs Lab 03/10/16 0148  NA 130*  K 3.5  CL 92*  CO2 28  GLUCOSE 179*  BUN 11  CREATININE 1.01*  CALCIUM 8.8*    GFR: Estimated Creatinine Clearance: 33.7 mL/min (by C-G formula based on SCr of 1.01 mg/dL).  Liver Function Tests:  Recent Labs Lab 03/10/16 0148  AST 24  ALT 10*  ALKPHOS 57  BILITOT 0.3  PROT 7.6  ALBUMIN 2.8*   No results for input(s): LIPASE, AMYLASE in the last 168 hours. No results for input(s): AMMONIA in the last 168 hours.  Coagulation Profile:  Recent Labs Lab 03/10/16 0438  INR 1.07    Cardiac Enzymes:  Recent Labs Lab 03/10/16 0438 03/10/16 0934  TROPONINI 0.41* 0.47*    BNP (last 3 results) No results for input(s): PROBNP in the last 8760 hours.  HbA1C: No results for input(s): HGBA1C in the last 72 hours.  CBG: No results for input(s): GLUCAP in the last 168 hours.  Lipid Profile:  Recent Labs  03/10/16 0438  CHOL 187  HDL 52  LDLCALC 118*  TRIG 86  CHOLHDL 3.6    Thyroid Function Tests:  Recent Labs  03/10/16 0438  TSH 2.126    Anemia Panel: No results for input(s): VITAMINB12, FOLATE, FERRITIN, TIBC, IRON, RETICCTPCT in the last 72 hours.  Urine analysis:    Component Value Date/Time   COLORURINE YELLOW 03/10/2016 0310   APPEARANCEUR CLEAR 03/10/2016 0310   LABSPEC 1.014 03/10/2016 0310   PHURINE  6.0 03/10/2016 0310   GLUCOSEU NEGATIVE 03/10/2016 0310   HGBUR NEGATIVE 03/10/2016 0310   BILIRUBINUR NEGATIVE 03/10/2016 0310   KETONESUR NEGATIVE 03/10/2016 0310   PROTEINUR NEGATIVE 03/10/2016 0310   NITRITE NEGATIVE 03/10/2016 0310   LEUKOCYTESUR NEGATIVE 03/10/2016 0310    Sepsis Labs: Lactic Acid, Venous    Component Value Date/Time   LATICACIDVEN 3.0 (HH) 03/10/2016 0438    MICROBIOLOGY: Recent Results (from the past 240 hour(s))  Respiratory Panel by PCR     Status: None   Collection Time: 03/10/16  4:38 AM  Result Value Ref Range Status   Adenovirus NOT DETECTED NOT DETECTED Final   Coronavirus 229E NOT DETECTED NOT DETECTED Final   Coronavirus HKU1 NOT DETECTED NOT DETECTED Final   Coronavirus NL63 NOT DETECTED NOT DETECTED Final   Coronavirus OC43 NOT DETECTED NOT DETECTED Final   Metapneumovirus NOT DETECTED NOT DETECTED Final   Rhinovirus / Enterovirus NOT DETECTED NOT DETECTED Final   Influenza A  NOT DETECTED NOT DETECTED Final   Influenza B NOT DETECTED NOT DETECTED Final   Parainfluenza Virus 1 NOT DETECTED NOT DETECTED Final   Parainfluenza Virus 2 NOT DETECTED NOT DETECTED Final   Parainfluenza Virus 3 NOT DETECTED NOT DETECTED Final   Parainfluenza Virus 4 NOT DETECTED NOT DETECTED Final   Respiratory Syncytial Virus NOT DETECTED NOT DETECTED Final   Bordetella pertussis NOT DETECTED NOT DETECTED Final   Chlamydophila pneumoniae NOT DETECTED NOT DETECTED Final   Mycoplasma pneumoniae NOT DETECTED NOT DETECTED Final    RADIOLOGY STUDIES/RESULTS: Dg Chest 2 View  Result Date: 03/10/2016 CLINICAL DATA:  Shortness of breath tonight. History of COPD, hypertension, rheumatoid arthritis. EXAM: CHEST  2 VIEW COMPARISON:  Chest radiograph February 28, 2016 FINDINGS: The cardiomediastinal silhouette is normal, mildly calcified aortic knob. Diffuse interstitial prominence increased from prior radiograph without pleural effusion or focal consolidation. Increased lung  volumes with flattened hemidiaphragms. Mild apical pleural thickening. No pneumothorax. Osteopenia. IMPRESSION: Increasing interstitial prominence suggests atypical infection, less likely pulmonary edema. Electronically Signed   By: Awilda Metro M.D.   On: 03/10/2016 02:07   Dg Chest 2 View  Result Date: 02/28/2016 CLINICAL DATA:  COPD, several weeks of fatigue and weakness, history of hypertension, COPD, rheumatoid arthritis, former smoker. EXAM: CHEST  2 VIEW COMPARISON:  Chest x-ray of June 19, 2014 FINDINGS: The lungs are mildly hyperinflated. The interstitial markings are coarse though stable. There is no alveolar infiltrate or pleural effusion. No pulmonary masses are observed. There is biapical pleural thickening which is stable. The heart and pulmonary vascularity are normal. There is calcification in the wall of the aortic arch. There is multilevel degenerative disc disease of the thoracic spine. IMPRESSION: COPD. There is no pneumonia nor other acute cardiopulmonary abnormality. Aortic atherosclerosis. Electronically Signed   By: David  [place] M.D.   On: 02/28/2016 13:55     LOS: 0 days   Jeoffrey Massed, MD  [facility] Pager:[phone]  If 7PM-7AM, please contact night-coverage [email or web address] Password [facility] 03/10/2016, 11:49 AM

## 2016-03-10 NOTE — Progress Notes (Signed)
Lactic acid 3.0, MD notified, will continue to monitor, Thanks, Lavonda Jumbo RN

## 2016-03-10 NOTE — Evaluation (Signed)
Physical Therapy Evaluation Patient Details Name: Alexis Andrews MRN: [id number] DOB: 12/29/1926 Today's Date: 03/10/2016   History of Present Illness  Alexis Andrews is a 82 y.o. female with medical history significant of COPD, hypertension, rheumatoid arthritis, who presents with cough, shortness of breath and fever. Chest x-ray in ED suggestive of atypical infection. Of note, pt recently finished course of Cipro for UTI.  Clinical Impression  Pt admitted with above diagnosis. Pt currently with functional limitations due to the deficits listed below (see PT Problem List). Pt ambulated 40' within room with RW and min-guard A. She demonstrated generalized weakness and fatigue in her mobility and would benefit from use of RW during initial phase of recover as well as acute PT followed by HHPT.  Pt will benefit from skilled PT to increase their independence and safety with mobility to allow discharge to the venue listed below.       Follow Up Recommendations Home health PT;Supervision for mobility/OOB    Equipment Recommendations  None recommended by PT    Recommendations for Other Services       Precautions / Restrictions Precautions Precautions: Other (comment) Precaution Comments: monitor O2 sats Restrictions Weight Bearing Restrictions: No      Mobility  Bed Mobility Overal bed mobility: Modified Independent             General bed mobility comments: able to come to EOB with increased time but no physical assistance  Transfers Overall transfer level: Needs assistance Equipment used: Rolling walker (2 wheeled) Transfers: Sit to/from Stand Sit to Stand: Min guard         General transfer comment: min-guard for safety from bed and recliner  Ambulation/Gait Ambulation/Gait assistance: Min guard Ambulation Distance (Feet): 40 Feet Assistive device: Rolling walker (2 wheeled) Gait Pattern/deviations: Step-through pattern;Trunk flexed;Decreased stride length Gait velocity:  decreased Gait velocity interpretation: <1.8 ft/sec, indicative of risk for recurrent falls General Gait Details: pt with slow, cautious ambulation but no LOB with RW. When asked if she wanted to try without RW, she declined stating that she did not feel steady yet. No ise of RW prior to this admission  Stairs            Wheelchair Mobility    Modified Rankin (Stroke Patients Only)       Balance Overall balance assessment: Needs assistance Sitting-balance support: No upper extremity supported Sitting balance-Leahy Scale: Good     Standing balance support: Bilateral upper extremity supported Standing balance-Leahy Scale: Poor Standing balance comment: unsteady without UE support                             Pertinent Vitals/Pain Pain Assessment: No/denies pain    Home Living Family/patient expects to be discharged to:: Private residence Living Arrangements: Children Available Help at Discharge: Family;Available 24 hours/day Type of Home: House       Home Layout: Two level Home Equipment: Walker - 2 wheels;Wheelchair - manual Additional Comments: pt was placed on O2 10 days ago, no history of needing it before that.     Prior Function Level of Independence: Independent         Comments: pt was able to go up and down stairs to bedroom 4-5x/ day without trouble before getting sick. Has needed assistance since becoming sick     Hand Dominance        Extremity/Trunk Assessment   Upper Extremity Assessment: Generalized weakness  Lower Extremity Assessment: Generalized weakness      Cervical / Trunk Assessment: Kyphotic  Communication   Communication: No difficulties  Cognition Arousal/Alertness: Awake/alert Behavior During Therapy: WFL for tasks assessed/performed Overall Cognitive Status: Within Functional Limits for tasks assessed                      General Comments General comments (skin integrity, edema, etc.):  O2 sats remained 93% on 2 L O2 with ambulation, HR in 80's    Exercises General Exercises - Lower Extremity Ankle Circles/Pumps: AROM;Both;10 reps;Seated Long Arc Quad: AROM;Both;10 reps;Seated Hip Flexion/Marching: AROM;Both;10 reps;Seated      Assessment/Plan    PT Assessment Patient needs continued PT services  PT Diagnosis Difficulty walking;Generalized weakness   PT Problem List Decreased strength;Decreased activity tolerance;Decreased balance;Decreased mobility;Decreased knowledge of use of DME;Decreased knowledge of precautions;Cardiopulmonary status limiting activity  PT Treatment Interventions DME instruction;Gait training;Stair training;Functional mobility training;Therapeutic activities;Therapeutic exercise;Balance training;Patient/family education   PT Goals (Current goals can be found in the Care Plan section) Acute Rehab PT Goals Patient Stated Goal: return home PT Goal Formulation: With patient Time For Goal Achievement: 03/24/16 Potential to Achieve Goals: Good    Frequency Min 3X/week   Barriers to discharge Inaccessible home environment flight of steps to bedroom    Co-evaluation               End of Session Equipment Utilized During Treatment: Gait belt;Oxygen Activity Tolerance: Patient tolerated treatment well Patient left: in chair;with call bell/phone within reach;with family/visitor present Nurse Communication: Mobility status         Time: 0932-3557 PT Time Calculation (min) (ACUTE ONLY): 34 min   Charges:   PT Evaluation $PT Eval Moderate Complexity: 1 Procedure PT Treatments $Gait Training: 8-22 mins   PT G Codes:      Lyanne Co, PT  Acute Rehab Services  [phone]   [place], [place] 03/10/2016, 1:12 PM

## 2016-03-10 NOTE — ED Provider Notes (Signed)
[facility]-EMERGENCY DEPT Provider Note   CSN: [id number] Arrival date & time: 03/10/16  0036  By signing my name below, I, Majel Homer, attest that this documentation has been prepared under the direction and in the presence of Rakel Junio Randall An, MD . Electronically Signed: Majel Homer, Scribe. 03/10/2016. 1:11 AM.  History   Chief Complaint Chief Complaint  Patient presents with  . Shortness of Breath   The history is provided by the patient and a relative. No language interpreter was used.   HPI Comments: Alexis Andrews is a 82 y.o. female with PMHx of COPD and HTN, who presents to the Emergency Department complaining of gradually improving, shortness of breath with associated productive cough that began ~1 week ago. Per family, pt recently visited the ED on 02/28/16 for possible pneumonia but was diagnosed with a UTI and prescribed Cipro. Pt's daughter reports pt began to have increasing shortness of breath 2 days after she finished her Cipro medication. She states associated fever (TMAX 100.1).   Past Medical History:  Diagnosis Date  . COPD (chronic obstructive pulmonary disease) (HCC)   . Heart murmur   . Hypertension   . Rheumatoid arthritis(714.0)     There are no active problems to display for this patient.   Past Surgical History:  Procedure Laterality Date  . hammertoe repair    . HEMORRHOID SURGERY  1960  . TONSILLECTOMY  1946    OB History    No data available       Home Medications    Prior to Admission medications   Medication Sig Start Date End Date Taking? Authorizing Provider  aspirin 81 MG tablet Take 81 mg by mouth daily.   Yes Historical Provider, MD  atenolol (TENORMIN) 25 MG tablet Take 25 mg by mouth daily.   Yes Historical Provider, MD  Golimumab (SIMPONI [place]) Inject into the skin See admin instructions. Every 8 weeks   Yes Historical Provider, MD  loratadine (CLARITIN) 10 MG tablet Take 10 mg by mouth daily.   Yes Historical Provider, MD  tiotropium  (SPIRIVA) 18 MCG inhalation capsule Place 18 mcg into inhaler and inhale daily.   Yes Historical Provider, MD    Family History No family history on file.  Social History Social History  Substance Use Topics  . Smoking status: Former Smoker    Years: 19.00  . Smokeless tobacco: Never Used  . Alcohol use 0.6 oz/week    1 Glasses of wine per week     Comment: Ocassionaly     Allergies   Betadine [povidone iodine] and Keflex [cephalexin]   Review of Systems Review of Systems  Constitutional: Positive for fever.  Respiratory: Positive for cough and shortness of breath.   All other systems reviewed and are negative.  Physical Exam Updated Vital Signs Temp 100.1 F (37.8 C) (Oral)   Ht 5' 1.5" (1.562 m)   Wt 148 lb (67.1 kg)   SpO2 92%   BMI 27.51 kg/m   Physical Exam  Constitutional: She is oriented to person, place, and time. She appears well-developed and well-nourished.  HENT:  Head: Normocephalic and atraumatic.  Eyes: Conjunctivae are normal. Pupils are equal, round, and reactive to light. Right eye exhibits no discharge. Left eye exhibits no discharge. No scleral icterus.  Neck: Normal range of motion. No JVD present. No tracheal deviation present.  Pulmonary/Chest: Effort normal. No stridor.  Rhonchi on left lung   Musculoskeletal: She exhibits no edema.  Neurological: She is alert and oriented to  person, place, and time. Coordination normal.  Psychiatric: She has a normal mood and affect. Her behavior is normal. Judgment and thought content normal.  Nursing note and vitals reviewed.  ED Treatments / Results  Labs (all labs ordered are listed, but only abnormal results are displayed) Labs Reviewed  COMPREHENSIVE METABOLIC PANEL - Abnormal; Notable for the following:       Result Value   Sodium 130 (*)    Chloride 92 (*)    Glucose, Bld 179 (*)    Creatinine, Ser 1.01 (*)    Calcium 8.8 (*)    Albumin 2.8 (*)    ALT 10 (*)    GFR calc non Af Amer 49  (*)    GFR calc Af Amer 56 (*)    All other components within normal limits  CBC WITH DIFFERENTIAL/PLATELET - Abnormal; Notable for the following:    WBC 13.0 (*)    RBC 3.57 (*)    Hemoglobin 10.9 (*)    HCT 35.3 (*)    Neutro Abs 11.5 (*)    All other components within normal limits  I-STAT TROPOININ, ED - Abnormal; Notable for the following:    Troponin i, poc 0.39 (*)    All other components within normal limits  URINE CULTURE  URINALYSIS, ROUTINE W REFLEX MICROSCOPIC (NOT AT [facility])  CBC WITH DIFFERENTIAL/PLATELET  BRAIN NATRIURETIC PEPTIDE    EKG  EKG Interpretation  Date/Time:  Monday March 10 2016 00:37:02 EDT Ventricular Rate:  110 PR Interval:    QRS Duration: 80 QT Interval:  314 QTC Calculation: 425 R Axis:   65 Text Interpretation:  Sinus tachycardia Borderline prolonged PR interval Anteroseptal infarct, old Nonspecific repol abnormality, diffuse leads tachycardia.  Confirmed by Kandis Mannan ([id number]) on 03/10/2016 1:28:09 AM       Radiology Dg Chest 2 View  Result Date: 03/10/2016 CLINICAL DATA:  Shortness of breath tonight. History of COPD, hypertension, rheumatoid arthritis. EXAM: CHEST  2 VIEW COMPARISON:  Chest radiograph February 28, 2016 FINDINGS: The cardiomediastinal silhouette is normal, mildly calcified aortic knob. Diffuse interstitial prominence increased from prior radiograph without pleural effusion or focal consolidation. Increased lung volumes with flattened hemidiaphragms. Mild apical pleural thickening. No pneumothorax. Osteopenia. IMPRESSION: Increasing interstitial prominence suggests atypical infection, less likely pulmonary edema. Electronically Signed   By: Awilda Metro M.D.   On: 03/10/2016 02:07   Procedures Procedures  DIAGNOSTIC STUDIES:  Oxygen Saturation is 95% on [place], low by my interpretation.    COORDINATION OF CARE:  1:07 AM Discussed treatment plan with pt at bedside and pt agreed to plan.  Medications Ordered in  ED Medications  levofloxacin (LEVAQUIN) IVPB 500 mg (500 mg Intravenous New Bag/Given 03/10/16 0302)  aspirin chewable tablet 324 mg (324 mg Oral Given 03/10/16 0235)    Initial Impression / Assessment and Plan / ED Course  I have reviewed the triage vital signs and the nursing notes.  Pertinent labs & imaging results that were available during my care of the patient were reviewed by me and considered in my medical decision making (see chart for details).  Clinical Course    Patient is a very pleasant 82 yo female with O2 dependent COPD presenting with acute SOB. Recently on cipro for UTI.  In normal state of health, when developed SOB tonight with wheezing.  Patient did not have any rescue inhalors at home so called EMS. Given solumedrol, albuterol by rescue, now feels back to baseline. Patient had recent treatment with Cipro for  pneumonia versus UTI. She improved greatly during treatment and after treatment stopped she got worse.  Will get xray for pna, get urine.    3:13 AM X-ray shows atypical pneumonia versus fluid overload. Will send BNP. Patient has mild trop leak. No ischemic changes on EKG. Will admit for serial  troponins, IV abx for failed treatment of atypical pneumonia.   I personally performed the services described in this documentation, which was scribed in my presence. The recorded information has been reviewed and is accurate.   Final Clinical Impressions(s) / ED Diagnoses   Final diagnoses:  None    New Prescriptions New Prescriptions   No medications on file     Pratik Dalziel Randall An, MD 03/10/16 [phone]

## 2016-03-10 NOTE — ED Triage Notes (Addendum)
Brought via EMS from home for c/o SOB for about a week with productive cough.  Worse tonight.  Hx of COPD.  Given Albuterol 15 and atrovent 1 mg enroute as well as solumedrol 125mg  IV.  Room air sat 88-90 on arrival.  Placed on O2 at 2L [place].  Per family patient was treated for UTI on 8/17.  Finished cipro in the last couple of days.  Reports that pt was running a low grade temp with the UTI but has started with the cough and temp since finishing the cipro.

## 2016-03-10 NOTE — Progress Notes (Signed)
Troponin 0.41, no s/s, was 0.39, MD notified will continue to monitor, Thanks Lavonda Jumbo RN

## 2016-03-10 NOTE — H&P (Addendum)
History and Physical    Alexis Andrews SUP:[id number] DOB: 07-17-1926 DOA: 03/10/2016  Referring MD/NP/PA:   PCP: Ginette Otto, MD   Patient coming from:  The patient is coming from home.  At baseline, pt is partially dependent for her ADL.   Chief Complaint: Cough, shortness of breath, fever  HPI: Alexis Andrews is a 82 y.o. female with medical history significant of COPD, hypertension, rheumatoid arthritis on Golimumab injection (q8week, last dose was 4 weeks ago), who presents with cough, shortness of breath and fever.  Per patient's daughter, patient has been having shortness and cough for almost 2 weeks. During this period of time, she was treated for UTI with ciprofloxacin from 8/17 to 8/23, and her symptoms of cough and shortness of breath have improved with ciprofloxacin. 2 days later after ciprofloxacin was stopped, patient's shortness of breath and cough have progressively getting worse. Patient had oxygen desaturation to 80s per her daughter who is a Engineer, civil (consulting).  Patient was started with Spriva inhaler and nasal cannula oxygen by her doctor on 02/28/16. She coughs up yellow colored sputum. She also developed fever and chills. Patient does not have chest pain. Per her daughter, patient had urinary urgency which improved significantly after treated with ciprofloxacin. Patient denies chest pain. She has nausea, but no vomiting or abdominal pain. She has one bowel movement with loose stool today. No rashes, no unilateral weakness.  ED Course: pt was found to have WBC 13.0, troponin 0.39, BNP 196, sodium 130, normal potassium, creatinine 1.01, temperature 100.1, tachycardia, tachypnea, oxygen saturation 92%, chest x-ray showed increasing interstitial prominence suggests atypical infection, less likely pulmonary edema. Pt is admitted to telemetry bed for further erection treatment as inpatient.  Review of Systems:   General: has fevers, chills, no changes in body weight, has poor appetite, has  fatigue HEENT: no blurry vision, hearing changes or sore throat Respiratory: has dyspnea, coughing CV: no chest pain, no palpitations GI: has nausea, no vomiting, abdominal pain, constipation. Has one loose stool BM today GU: no dysuria, burning on urination, increased urinary frequency, hematuria  Ext: has mild leg edema. Has deformity both hands and feet  Neuro: no unilateral weakness, numbness, or tingling, no vision change or hearing loss Skin: no rash MSK: No muscle spasm, no deformity, no limitation of range of movement in spin Heme: No easy bruising.  Travel history: No recent long distant travel.  Allergy:  Allergies  Allergen Reactions  . Betadine [Povidone Iodine] Rash  . Keflex [Cephalexin] Itching and Rash    Past Medical History:  Diagnosis Date  . COPD (chronic obstructive pulmonary disease) (HCC)   . Heart murmur   . Hypertension   . Rheumatoid arthritis(714.0)     Past Surgical History:  Procedure Laterality Date  . hammertoe repair    . HEMORRHOID SURGERY  1960  . TONSILLECTOMY  1946    Social History:  reports that she has quit smoking. She quit after 19.00 years of use. She has never used smokeless tobacco. She reports that she drinks about 0.6 oz of alcohol per week . Her drug history is not on file.  Family History:  Family History  Problem Relation Age of Onset  . Lung cancer Brother      Prior to Admission medications   Medication Sig Start Date End Date Taking? Authorizing Provider  aspirin 81 MG tablet Take 81 mg by mouth daily.   Yes Historical Provider, MD  atenolol (TENORMIN) 25 MG tablet Take 25 mg by mouth daily.  Yes Historical Provider, MD  Golimumab (SIMPONI [place]) Inject into the skin See admin instructions. Every 8 weeks   Yes Historical Provider, MD  loratadine (CLARITIN) 10 MG tablet Take 10 mg by mouth daily.   Yes Historical Provider, MD  tiotropium (SPIRIVA) 18 MCG inhalation capsule Place 18 mcg into inhaler and inhale daily.    Yes Historical Provider, MD    Physical Exam: Vitals:   03/10/16 0315 03/10/16 0345 03/10/16 0400 03/10/16 0416  BP: (!) 102/52 113/56 (!) 93/43   Pulse: 87 91 90 87  Resp: 17 24 23 26   Temp:      TempSrc:      SpO2: 95% 98% 97% 98%  Weight:      Height:       General: Not in acute distress HEENT:       Eyes: PERRL, EOMI, no scleral icterus.       ENT: No discharge from the ears and nose, no pharynx injection, no tonsillar enlargement.        Neck: No JVD, no bruit, no mass felt. Heme: No neck lymph node enlargement. Cardiac: S1/S2, RRR, No murmurs, No gallops or rubs. Respiratory: has defused rhonchi bilaterally. No rales or rubs. GI: Soft, nondistended, nontender, no rebound pain, no organomegaly, BS present. GU: No hematuria Ext: has trace leg edema bilaterally. 2+DP/PT pulse bilaterally. Has deformity both hands and feet  Musculoskeletal: No joint deformities, No joint redness or warmth, no limitation of ROM in spin. Skin: No rashes.  Neuro: Alert, oriented X3, cranial nerves II-XII grossly intact, moves all extremities normally. Psych: Patient is not psychotic, no suicidal or hemocidal ideation.  Labs on Admission: I have personally reviewed following labs and imaging studies  CBC:  Recent Labs Lab 03/10/16 0148  WBC 13.0*  NEUTROABS 11.5*  HGB 10.9*  HCT 35.3*  MCV 98.9  PLT 323   Basic Metabolic Panel:  Recent Labs Lab 03/10/16 0148  NA 130*  K 3.5  CL 92*  CO2 28  GLUCOSE 179*  BUN 11  CREATININE 1.01*  CALCIUM 8.8*   GFR: Estimated Creatinine Clearance: 34.8 mL/min (by C-G formula based on SCr of 1.01 mg/dL). Liver Function Tests:  Recent Labs Lab 03/10/16 0148  AST 24  ALT 10*  ALKPHOS 57  BILITOT 0.3  PROT 7.6  ALBUMIN 2.8*   No results for input(s): LIPASE, AMYLASE in the last 168 hours. No results for input(s): AMMONIA in the last 168 hours. Coagulation Profile: No results for input(s): INR, PROTIME in the last 168  hours. Cardiac Enzymes: No results for input(s): CKTOTAL, CKMB, CKMBINDEX, TROPONINI in the last 168 hours. BNP (last 3 results) No results for input(s): PROBNP in the last 8760 hours. HbA1C: No results for input(s): HGBA1C in the last 72 hours. CBG: No results for input(s): GLUCAP in the last 168 hours. Lipid Profile: No results for input(s): CHOL, HDL, LDLCALC, TRIG, CHOLHDL, LDLDIRECT in the last 72 hours. Thyroid Function Tests: No results for input(s): TSH, T4TOTAL, FREET4, T3FREE, THYROIDAB in the last 72 hours. Anemia Panel: No results for input(s): VITAMINB12, FOLATE, FERRITIN, TIBC, IRON, RETICCTPCT in the last 72 hours. Urine analysis:    Component Value Date/Time   COLORURINE YELLOW 03/10/2016 0310   APPEARANCEUR CLEAR 03/10/2016 0310   LABSPEC 1.014 03/10/2016 0310   PHURINE 6.0 03/10/2016 0310   GLUCOSEU NEGATIVE 03/10/2016 0310   HGBUR NEGATIVE 03/10/2016 0310   BILIRUBINUR NEGATIVE 03/10/2016 0310   KETONESUR NEGATIVE 03/10/2016 0310   PROTEINUR NEGATIVE 03/10/2016 0310  NITRITE NEGATIVE 03/10/2016 0310   LEUKOCYTESUR NEGATIVE 03/10/2016 0310   Sepsis Labs: @LABRCNTIP (procalcitonin:4,lacticidven:4) )No results found for this or any previous visit (from the past 240 hour(s)).   Radiological Exams on Admission: Dg Chest 2 View  Result Date: 03/10/2016 CLINICAL DATA:  Shortness of breath tonight. History of COPD, hypertension, rheumatoid arthritis. EXAM: CHEST  2 VIEW COMPARISON:  Chest radiograph February 28, 2016 FINDINGS: The cardiomediastinal silhouette is normal, mildly calcified aortic knob. Diffuse interstitial prominence increased from prior radiograph without pleural effusion or focal consolidation. Increased lung volumes with flattened hemidiaphragms. Mild apical pleural thickening. No pneumothorax. Osteopenia. IMPRESSION: Increasing interstitial prominence suggests atypical infection, less likely pulmonary edema. Electronically Signed   By: March 01, 2016  M.D.   On: 03/10/2016 02:07     EKG: Independently reviewed. Sinus rhythm, QTC 442, anteroseptal infarction pattern.  Assessment/Plan Principal Problem:   Acute on chronic respiratory failure with hypoxia (HCC) Active Problems:   Elevated troponin   Hypertension   COPD exacerbation (HCC)   Sepsis (HCC)   Hyponatremia   RA (rheumatoid arthritis) (HCC)   Acute on chronic respiratory failure with hypoxia (HCC): Most likely due to COPD exacerbation given productive cough and diffused rhonchi on auscultation. Atypical infection of early stage of pneumonia cannot be completely ruled out. Patient does not have signs of DVT or chest pain, less likely to have PE. Pt is immunosuppressed due to Simponi injection, at risk of deteriorating.   -will admit patient to telemetry bed  -Nebulizers: scheduled Duoneb and prn albuterol -Solu-Medrol 60 mg IV  bid -levaquine was started in ED, will continue. If lactic acid is significantly elevated-->may need to broaden antibiotic coverage. -Mucinex for cough  -Urine S. Pneumococcal and Legionella antigen -Follow up blood culture x2, sputum culture, respiratory virus panel  Sepsis secondary to COPD exacerbation: Patient meets criteria for sepsis with leukocytosis, fever, tachycardia, tachypnea. Pending lactate. Currently hemodynamically stable. -will get Procalcitonin and trend lactic acid levels per sepsis protocol. -IVF: 1L of NS bolus in ED, followed by 100 cc/h   Elevated trop: Troponin 0.39. No chest pain. EKG showed nonspecific T-wave changes and anteroseptal infarction pattern. Most likely due to demanding ischemia secondary to sepsis. - cycle CE q6 x3 and repeat her EKG in the am  - prn Nitroglycerin if develops CP - Aspirin - Continue home atenolol - Risk factor stratification: will check FLP and A1C  - 2d echo  HTN: per her daguther, pt does not have history of hypertension, but hypertension is on her medical problem list. Blood pressure  113/50 -Continue home atenolol  Hyponatremia: Sodium 130. Likely due to decreased oral intake. Mental status is normal. -IV fluid as above -Follow-up BMP -Check TSH, Osmo of the serum and urine, urine sodium  Rheumatoid arthritis: has deformity both hands and feet. Symptoms are controlled by Golimumab injection, q8week, last dose was 4 weeks ago. -Continue Golimumab injection as outpt (not due)   DVT ppx: SQ Lovenox Code Status: Parial code (OK with CPR, but not intubation) Family Communication: Yes, patient's daughter at bed side Disposition Plan:  Anticipate discharge back to previous home environment Consults called:  none Admission status: Inpatient/tele   Date of Service 03/10/2016    03/12/2016 [facility] Pager [phone]  If 7PM-7AM, please contact night-coverage [email or web address] Password [facility] 03/10/2016, 4:33 AM

## 2016-03-11 ENCOUNTER — Inpatient Hospital Stay (HOSPITAL_COMMUNITY): Payer: Medicare Other

## 2016-03-11 ENCOUNTER — Encounter (HOSPITAL_COMMUNITY): Payer: Self-pay | Admitting: General Practice

## 2016-03-11 DIAGNOSIS — I350 Nonrheumatic aortic (valve) stenosis: Secondary | ICD-10-CM

## 2016-03-11 DIAGNOSIS — J441 Chronic obstructive pulmonary disease with (acute) exacerbation: Secondary | ICD-10-CM

## 2016-03-11 DIAGNOSIS — I248 Other forms of acute ischemic heart disease: Secondary | ICD-10-CM

## 2016-03-11 DIAGNOSIS — R7989 Other specified abnormal findings of blood chemistry: Secondary | ICD-10-CM

## 2016-03-11 DIAGNOSIS — J9621 Acute and chronic respiratory failure with hypoxia: Secondary | ICD-10-CM

## 2016-03-11 DIAGNOSIS — I10 Essential (primary) hypertension: Secondary | ICD-10-CM

## 2016-03-11 LAB — BASIC METABOLIC PANEL
Anion gap: 6 (ref 5–15)
BUN: 14 mg/dL (ref 6–20)
CHLORIDE: 94 mmol/L — AB (ref 101–111)
CO2: 27 mmol/L (ref 22–32)
CREATININE: 0.89 mg/dL (ref 0.44–1.00)
Calcium: 8.3 mg/dL — ABNORMAL LOW (ref 8.9–10.3)
GFR, EST NON AFRICAN AMERICAN: 57 mL/min — AB (ref >60)
Glucose, Bld: 160 mg/dL — ABNORMAL HIGH (ref 65–99)
POTASSIUM: 4.1 mmol/L (ref 3.5–5.1)
SODIUM: 127 mmol/L — AB (ref 135–145)

## 2016-03-11 LAB — URINE CULTURE: Culture: NO GROWTH

## 2016-03-11 LAB — OSMOLALITY, URINE: Osmolality, Ur: 220 mOsm/kg — ABNORMAL LOW (ref 300–900)

## 2016-03-11 LAB — HEMOGLOBIN A1C
Hgb A1c MFr Bld: 6.2 % — ABNORMAL HIGH (ref 4.8–5.6)
Mean Plasma Glucose: 131 mg/dL

## 2016-03-11 LAB — ECHOCARDIOGRAM COMPLETE
HEIGHTINCHES: 61.5 in
Weight: 2206.4 oz

## 2016-03-11 LAB — CBC
HCT: 27.9 % — ABNORMAL LOW (ref 36.0–46.0)
HEMOGLOBIN: 8.6 g/dL — AB (ref 12.0–15.0)
MCH: 30 pg (ref 26.0–34.0)
MCHC: 30.8 g/dL (ref 30.0–36.0)
MCV: 97.2 fL (ref 78.0–100.0)
PLATELETS: 271 10*3/uL (ref 150–400)
RBC: 2.87 MIL/uL — AB (ref 3.87–5.11)
RDW: 12.4 % (ref 11.5–15.5)
WBC: 13.9 10*3/uL — ABNORMAL HIGH (ref 4.0–10.5)

## 2016-03-11 LAB — SODIUM, URINE, RANDOM: SODIUM UR: 19 mmol/L

## 2016-03-11 LAB — OSMOLALITY: OSMOLALITY: 275 mosm/kg (ref 275–295)

## 2016-03-11 LAB — LEGIONELLA PNEUMOPHILA SEROGP 1 UR AG: L. PNEUMOPHILA SEROGP 1 UR AG: NEGATIVE

## 2016-03-11 MED ORDER — IPRATROPIUM-ALBUTEROL 0.5-2.5 (3) MG/3ML IN SOLN
3.0000 mL | Freq: Three times a day (TID) | RESPIRATORY_TRACT | Status: DC
  Administered 2016-03-11 – 2016-03-12 (×4): 3 mL via RESPIRATORY_TRACT
  Filled 2016-03-11 (×4): qty 3

## 2016-03-11 NOTE — Progress Notes (Signed)
  Echocardiogram 2D Echocardiogram has been performed.  Cathie Beams 03/11/2016, 9:53 AM

## 2016-03-11 NOTE — Progress Notes (Addendum)
PROGRESS NOTE        PATIENT DETAILS Name: Alexis Andrews Age: 82 y.o. Sex: female Date of Birth: Oct 28, 1926 Admit Date: 03/10/2016 Admitting Physician Lorretta Harp, MD GYJ:[email or web address],YOV Maisie Fus, MD  Brief Narrative: Patient is a 82 y.o. female with history of COPD-recently started on O2, history of rheumatoid arthritis on Golimumab injection presented with probable COPD exacerbation and atypical pneumonia. Improving with supportive measures.  Subjective: Coughing, shortness of breath have significantly improved. She is lying comfortably in bed. No chest pain.  Assessment/Plan: Principal Problem: COPD exacerbation: Improving with intravenous Solu-Medrol (continued to taper), bronchodilators and empiric levofloxacin. Probably provoked by atypical pneumonia. She is moving air well, with only a few scattered rhonchi this morning.  Continue current measures, we will continue to follow closely.  Active Problems: SIRS secondary to Community-acquired pneumonia: X-ray findings highly suggestive of atypical pneumonitis, continue levofloxacin. Respiratory virus panel negative, urine streptococcal antigen negative, Legionella antigen pending. Blood cultures pending. She is clinically improved, I do not think she has sepsis at this time-suspect elevated lactic acid likely from hypoxia rather than sepsis.  Elevated troponin: No chest pain-trend is flat-not consistent with ACS. Likely demand ischemia in the setting of hypoxia and pneumonia. 2-D echocardiogram pending, family requesting cardiology evaluation-have consulted.  Hyponatremia: Mild and asymptomatic-she appears euvolemic-could have some mild SIADH from pneumonia. Check serum/urine osmolality, urine sodium. TSH within normal limits. Chek Appears very mild, will repeat a.m. Suspect he just needs fluid restriction for now.  Anemia: Appears to have chronic anemia at baseline, suspect drop in hemoglobin secondary to IV fluids and  acute illness. No overt evidence of blood loss, continue to follow CBC periodically.  Hypertension: Controlled, continue atenolol  History of rheumatoid arthritis: Currently stable-resume Golimumab injections when she has recovered from pneumonia  Deconditioning/generalized weakness: Likely secondary to acute illness, she recently also had a UTI. PT evaluation completed, plans are for home health services on discharge  DVT Prophylaxis: Prophylactic Lovenox   Code Status: DNI  Family Communication: Daughter at bedside  Disposition Plan: Remain inpatient-requires 1-2 days in the hospital-but will plan on Home health   Antimicrobial agents: IV Levaquin 8/28>>  Procedures: None  CONSULTS:  None  Time spent: 25 minutes-Greater than 50% of this time was spent in counseling, explanation of diagnosis, planning of further management, and coordination of care.  MEDICATIONS: Anti-infectives    Start     Dose/Rate Route Frequency Ordered Stop   03/11/16 0600  levofloxacin (LEVAQUIN) IVPB 500 mg  Status:  Discontinued     500 mg 100 mL/hr over 60 Minutes Intravenous Every 24 hours 03/10/16 0403 03/10/16 0947   03/11/16 0300  levofloxacin (LEVAQUIN) IVPB 500 mg     500 mg 100 mL/hr over 60 Minutes Intravenous Every 24 hours 03/10/16 0947     03/10/16 0230  levofloxacin (LEVAQUIN) IVPB 500 mg     500 mg 100 mL/hr over 60 Minutes Intravenous  Once 03/10/16 0213 03/10/16 0402      Scheduled Meds: . aspirin EC  81 mg Oral Daily  . atenolol  25 mg Oral Daily  . enoxaparin (LOVENOX) injection  40 mg Subcutaneous Q24H  . ipratropium-albuterol  3 mL Nebulization TID  . levofloxacin (LEVAQUIN) IV  500 mg Intravenous Q24H  . loratadine  10 mg Oral Daily  . methylPREDNISolone (SOLU-MEDROL) injection  40 mg Intravenous Q12H   Continuous  Infusions: . sodium chloride Stopped (03/10/16 1052)   PRN Meds:.albuterol, dextromethorphan-guaiFENesin, nitroGLYCERIN   PHYSICAL EXAM: Vital  signs: Vitals:   03/10/16 2044 03/10/16 2336 03/11/16 0104 03/11/16 0401  BP:  132/69  (!) 128/51  Pulse: 81 76 86 76  Resp: 16 18 16 18   Temp:  98.5 F (36.9 C)  98.1 F (36.7 C)  TempSrc:  Oral  Oral  SpO2: 98% 98% 98% 100%  Weight:    62.6 kg (137 lb 14.4 oz)  Height:       Filed Weights   03/10/16 0043 03/10/16 0512 03/11/16 0401  Weight: 67.1 kg (148 lb) 62.6 kg (138 lb 1.6 oz) 62.6 kg (137 lb 14.4 oz)   Body mass index is 25.63 kg/m.   Gen Exam: Awake and alert with clear speech. Not in any distress -But looks chronically ill appearing Neck: Supple, No JVD.  Chest: Coarse breath sounds, expiratory rhonchi scattered all over CVS: S1 S2 Regular, no murmurs.  Abdomen: soft, BS +, non tender, non distended.  Extremities: no edema, lower extremities warm to touch. Neurologic: Non Focal.   Skin: No Rash or lesions   Wounds: N/A.    I have personally reviewed following labs and imaging studies  LABORATORY DATA: CBC:  Recent Labs Lab 03/10/16 0148 03/11/16 0348  WBC 13.0* 13.9*  NEUTROABS 11.5*  --   HGB 10.9* 8.6*  HCT 35.3* 27.9*  MCV 98.9 97.2  PLT 323 271    Basic Metabolic Panel:  Recent Labs Lab 03/10/16 0148 03/11/16 0348  NA 130* 127*  K 3.5 4.1  CL 92* 94*  CO2 28 27  GLUCOSE 179* 160*  BUN 11 14  CREATININE 1.01* 0.89  CALCIUM 8.8* 8.3*    GFR: Estimated Creatinine Clearance: 38.2 mL/min (by C-G formula based on SCr of 0.89 mg/dL).  Liver Function Tests:  Recent Labs Lab 03/10/16 0148  AST 24  ALT 10*  ALKPHOS 57  BILITOT 0.3  PROT 7.6  ALBUMIN 2.8*   No results for input(s): LIPASE, AMYLASE in the last 168 hours. No results for input(s): AMMONIA in the last 168 hours.  Coagulation Profile:  Recent Labs Lab 03/10/16 0438  INR 1.07    Cardiac Enzymes:  Recent Labs Lab 03/10/16 0438 03/10/16 0934 03/10/16 1611  TROPONINI 0.41* 0.47* 0.30*    BNP (last 3 results) No results for input(s): PROBNP in the last 8760  hours.  HbA1C:  Recent Labs  03/10/16 0438  HGBA1C 6.2*    CBG: No results for input(s): GLUCAP in the last 168 hours.  Lipid Profile:  Recent Labs  03/10/16 0438  CHOL 187  HDL 52  LDLCALC 118*  TRIG 86  CHOLHDL 3.6    Thyroid Function Tests:  Recent Labs  03/10/16 0438  TSH 2.126    Anemia Panel: No results for input(s): VITAMINB12, FOLATE, FERRITIN, TIBC, IRON, RETICCTPCT in the last 72 hours.  Urine analysis:    Component Value Date/Time   COLORURINE YELLOW 03/10/2016 0310   APPEARANCEUR CLEAR 03/10/2016 0310   LABSPEC 1.014 03/10/2016 0310   PHURINE 6.0 03/10/2016 0310   GLUCOSEU NEGATIVE 03/10/2016 0310   HGBUR NEGATIVE 03/10/2016 0310   BILIRUBINUR NEGATIVE 03/10/2016 0310   KETONESUR NEGATIVE 03/10/2016 0310   PROTEINUR NEGATIVE 03/10/2016 0310   NITRITE NEGATIVE 03/10/2016 0310   LEUKOCYTESUR NEGATIVE 03/10/2016 0310    Sepsis Labs: Lactic Acid, Venous    Component Value Date/Time   LATICACIDVEN 3.0 (HH) 03/10/2016 0438    MICROBIOLOGY: Recent  Results (from the past 240 hour(s))  Respiratory Panel by PCR     Status: None   Collection Time: 03/10/16  4:38 AM  Result Value Ref Range Status   Adenovirus NOT DETECTED NOT DETECTED Final   Coronavirus 229E NOT DETECTED NOT DETECTED Final   Coronavirus HKU1 NOT DETECTED NOT DETECTED Final   Coronavirus NL63 NOT DETECTED NOT DETECTED Final   Coronavirus OC43 NOT DETECTED NOT DETECTED Final   Metapneumovirus NOT DETECTED NOT DETECTED Final   Rhinovirus / Enterovirus NOT DETECTED NOT DETECTED Final   Influenza A NOT DETECTED NOT DETECTED Final   Influenza B NOT DETECTED NOT DETECTED Final   Parainfluenza Virus 1 NOT DETECTED NOT DETECTED Final   Parainfluenza Virus 2 NOT DETECTED NOT DETECTED Final   Parainfluenza Virus 3 NOT DETECTED NOT DETECTED Final   Parainfluenza Virus 4 NOT DETECTED NOT DETECTED Final   Respiratory Syncytial Virus NOT DETECTED NOT DETECTED Final   Bordetella  pertussis NOT DETECTED NOT DETECTED Final   Chlamydophila pneumoniae NOT DETECTED NOT DETECTED Final   Mycoplasma pneumoniae NOT DETECTED NOT DETECTED Final    RADIOLOGY STUDIES/RESULTS: Dg Chest 2 View  Result Date: 03/10/2016 CLINICAL DATA:  Shortness of breath tonight. History of COPD, hypertension, rheumatoid arthritis. EXAM: CHEST  2 VIEW COMPARISON:  Chest radiograph February 28, 2016 FINDINGS: The cardiomediastinal silhouette is normal, mildly calcified aortic knob. Diffuse interstitial prominence increased from prior radiograph without pleural effusion or focal consolidation. Increased lung volumes with flattened hemidiaphragms. Mild apical pleural thickening. No pneumothorax. Osteopenia. IMPRESSION: Increasing interstitial prominence suggests atypical infection, less likely pulmonary edema. Electronically Signed   By: Awilda Metro M.D.   On: 03/10/2016 02:07   Dg Chest 2 View  Result Date: 02/28/2016 CLINICAL DATA:  COPD, several weeks of fatigue and weakness, history of hypertension, COPD, rheumatoid arthritis, former smoker. EXAM: CHEST  2 VIEW COMPARISON:  Chest x-ray of June 19, 2014 FINDINGS: The lungs are mildly hyperinflated. The interstitial markings are coarse though stable. There is no alveolar infiltrate or pleural effusion. No pulmonary masses are observed. There is biapical pleural thickening which is stable. The heart and pulmonary vascularity are normal. There is calcification in the wall of the aortic arch. There is multilevel degenerative disc disease of the thoracic spine. IMPRESSION: COPD. There is no pneumonia nor other acute cardiopulmonary abnormality. Aortic atherosclerosis. Electronically Signed   By: David  [place] M.D.   On: 02/28/2016 13:55     LOS: 1 day   Jeoffrey Massed, MD  [facility] Pager:[phone]  If 7PM-7AM, please contact night-coverage [email or web address] Password [facility] 03/11/2016, 10:17 AM

## 2016-03-11 NOTE — Consult Note (Signed)
Cardiology Consult    Patient ID: Alexis Andrews MRN: [id number], DOB/AGE: 1926/09/13   Admit date: 03/10/2016 Date of Consult: 03/11/2016  Primary Physician: Ginette Otto, MD Reason for Consult: Elevated Troponin Primary Cardiologist: New Requesting Provider: Dr. Jerral Ralph   History of Present Illness    Alexis Andrews is a 82 y.o. female with past medical history of HTN, COPD, and RA who presented to [facility] ED on 03/10/2016 for worsening dyspnea.   She was recently seen and diagnosed with a UTI and prescribed Cipro. Two days after finishing the antibiotic, she noticed worsening dyspnea and a mild fever. Had oxygen desaturations into the 80's according to patients family (daughter she lives with is a Engineer, civil (consulting)).   While in the ED, initial labs showed a WBC of 13.0, Hgb 10.9, and platelets 323. Na+ 130. K+ 3.5, and creatinine 1.01. BNP 196. Initial troponin 0.39. Lactic Acid 3.0. TSH 2.126. Lipid Panel with total cholesterol 187, HDL 52, and LDL 118. Initial EKG showed sinus tachycardia, HR 110, with minimal TWI in V1 and V2. CXR showed increasing interstitial prominence suggesting atypical infection, less likely pulmonary edema.   She has been admitted for SIRS secondary to CAP and COPD exacerbation. Currently on Levaquin and receiving Solu-Medrol. Cyclic troponin values have been 0.41, 0.47, and 0.30.  In talking with the patient and her granddaughter who is at the bedside, she denies any prior cardiac history. Reports being told she has a "heart thing" many years ago, which the granddaughter thinks was likely a murmur. Denies any history of known CAD or prior ischemic evaluation. Denies any history of HLD or Type 2 DM. Smoked for approximately 10 years but quit in her 53's. Denies any excessive alcohol use.   For her age, she is active at baseline. Lives with her daughter but walks at least 50 yards to the mailbox daily without any anginal symptoms. Denies any recent chest discomfort.  Says she has dyspnea at times, but this has been worse over the past 3 weeks and was relieved with her home breathing treatments until she ran out of the medication.   Says her dyspnea has improved over this admission. Denies any chest discomfort or palpitations while here.   Past Medical History   Past Medical History:  Diagnosis Date  . COPD (chronic obstructive pulmonary disease) (HCC)   . Heart murmur   . Hypertension   . Rheumatoid arthritis(714.0)     Past Surgical History:  Procedure Laterality Date  . hammertoe repair    . HEMORRHOID SURGERY  1960  . TONSILLECTOMY  1946     Allergies  Allergies  Allergen Reactions  . Betadine [Povidone Iodine] Rash  . Keflex [Cephalexin] Itching and Rash    Inpatient Medications    . aspirin EC  81 mg Oral Daily  . atenolol  25 mg Oral Daily  . enoxaparin (LOVENOX) injection  40 mg Subcutaneous Q24H  . ipratropium-albuterol  3 mL Nebulization TID  . levofloxacin (LEVAQUIN) IV  500 mg Intravenous Q24H  . loratadine  10 mg Oral Daily  . methylPREDNISolone (SOLU-MEDROL) injection  40 mg Intravenous Q12H    Family History    Family History  Problem Relation Age of Onset  . Lung cancer Brother     Social History    Social History   Social History  . Marital status: Widowed    Spouse name: N/A  . Number of children: N/A  . Years of education: N/A   Occupational History  .  Not on file.   Social History Main Topics  . Smoking status: Former Smoker    Years: 19.00  . Smokeless tobacco: Never Used  . Alcohol use 0.6 oz/week    1 Glasses of wine per week     Comment: Ocassionaly  . Drug use: Unknown  . Sexual activity: Not on file   Other Topics Concern  . Not on file   Social History Narrative  . No narrative on file     Review of Systems    General:  No chills, fever, night sweats or weight changes.  Cardiovascular:  No chest pain, edema, orthopnea, palpitations, paroxysmal nocturnal dyspnea. Positive for  dyspnea on exertion.  Dermatological: No rash, lesions/masses Respiratory: Positive for cough and dyspnea Urologic: No hematuria, dysuria Abdominal:   No nausea, vomiting, diarrhea, bright red blood per rectum, melena, or hematemesis Neurologic:  No visual changes, wkns, changes in mental status. All other systems reviewed and are otherwise negative except as noted above.  Physical Exam    Blood pressure (!) 128/51, pulse 76, temperature 98.1 F (36.7 C), temperature source Oral, resp. rate 18, height 5' 1.5" (1.562 m), weight 137 lb 14.4 oz (62.6 kg), SpO2 100 %.  General: Pleasant, elderly Caucasian female appearing in NAD Psych: Normal affect. Neuro: Alert and oriented X 3. Moves all extremities spontaneously. HEENT: Normal  Neck: Supple without bruits or JVD. Lungs:  Resp regular and unlabored, rhonchi in upper lung fields, minimal rales at bases bilaterally. Heart: RRR no s3, s4, 2/6 SEM at RUSB. Abdomen: Soft, non-tender, non-distended, BS + x 4.  Extremities: No clubbing, cyanosis or edema. DP/PT/Radials 2+ and equal bilaterally.  Labs    Troponin [facility] Test)  Recent Labs  03/10/16 0156  TROPIPOC 0.39*    Recent Labs  03/10/16 0438 03/10/16 0934 03/10/16 1611  TROPONINI 0.41* 0.47* 0.30*   Lab Results  Component Value Date   WBC 13.9 (H) 03/11/2016   HGB 8.6 (L) 03/11/2016   HCT 27.9 (L) 03/11/2016   MCV 97.2 03/11/2016   PLT 271 03/11/2016    Recent Labs Lab 03/10/16 0148 03/11/16 0348  NA 130* 127*  K 3.5 4.1  CL 92* 94*  CO2 28 27  BUN 11 14  CREATININE 1.01* 0.89  CALCIUM 8.8* 8.3*  PROT 7.6  --   BILITOT 0.3  --   ALKPHOS 57  --   ALT 10*  --   AST 24  --   GLUCOSE 179* 160*   Lab Results  Component Value Date   CHOL 187 03/10/2016   HDL 52 03/10/2016   LDLCALC 118 (H) 03/10/2016   TRIG 86 03/10/2016   No results found for: [facility]   Radiology Studies    Dg Chest 2 View  Result Date: 03/10/2016 CLINICAL DATA:  Shortness  of breath tonight. History of COPD, hypertension, rheumatoid arthritis. EXAM: CHEST  2 VIEW COMPARISON:  Chest radiograph February 28, 2016 FINDINGS: The cardiomediastinal silhouette is normal, mildly calcified aortic knob. Diffuse interstitial prominence increased from prior radiograph without pleural effusion or focal consolidation. Increased lung volumes with flattened hemidiaphragms. Mild apical pleural thickening. No pneumothorax. Osteopenia. IMPRESSION: Increasing interstitial prominence suggests atypical infection, less likely pulmonary edema. Electronically Signed   By: Awilda Metro M.D.   On: 03/10/2016 02:07   Dg Chest 2 View  Result Date: 02/28/2016 CLINICAL DATA:  COPD, several weeks of fatigue and weakness, history of hypertension, COPD, rheumatoid arthritis, former smoker. EXAM: CHEST  2 VIEW COMPARISON:  Chest x-ray of June 19, 2014 FINDINGS: The lungs are mildly hyperinflated. The interstitial markings are coarse though stable. There is no alveolar infiltrate or pleural effusion. No pulmonary masses are observed. There is biapical pleural thickening which is stable. The heart and pulmonary vascularity are normal. There is calcification in the wall of the aortic arch. There is multilevel degenerative disc disease of the thoracic spine. IMPRESSION: COPD. There is no pneumonia nor other acute cardiopulmonary abnormality. Aortic atherosclerosis. Electronically Signed   By: David  [place] M.D.   On: 02/28/2016 13:55    EKG & Cardiac Imaging    EKG: 8/28: Sinus tachycardia, HR 110, with minimal TWI in V1 and V2.  Echocardiogram: 03/11/2016 Study Conclusions  - Left ventricle: The cavity size was normal. Wall thickness was   normal. Systolic function was normal. The estimated ejection   fraction was in the range of 60% to 65%. Wall motion was normal;   there were no regional wall motion abnormalities. Features are   consistent with a pseudonormal left ventricular filling pattern,    with concomitant abnormal relaxation and increased filling   pressure (grade 2 diastolic dysfunction). - Aortic valve: Moderately calcified annulus. Moderately thickened,   moderately calcified leaflets. There was mild stenosis. Valve   area (VTI): 1.91 cm^2. Valve area (Vmax): 1.62 cm^2. Valve area   (Vmean): 1.6 cm^2. - Pulmonary arteries: Systolic pressure was moderately increased.   PA peak pressure: 58 mm Hg (S).  Assessment & Plan    1. Elevated Troponin  - admitted with worsening dyspnea and diagnosed with SIRS secondary to CAP. Cyclic troponin values have been 0.41, 0.47, and 0.30. - she denies any prior cardiac history. No recent chest discomfort. Has noticed slightly worsening dyspnea over the past 3 weeks but improved with breathing treatments. Active for her age, ambulating at least 50 yards to the mailbox daily without any anginal symptoms.  - EKG shows minimal TWI in V1 and V2 with no prior tracings available for comparison.  - echocardiogram with preserved EF of 60% to 65% with no regional wall motion abnormalities. Grade 2 DD noted along with mild AS and elevated PA peak pressure of 58 mm Hg (S). - elevated troponin values likely secondary to demand ischemia in the setting of SIRS and CAP. EKG and echocardiogram are reassuring along with her lack of anginal symptoms. Would likely not require further ischemic evaluation while inpatient. If she had anginal symptoms, could consider NST with plans to pursue cath only if high-risk given her advanced age. Dr. [place] to see later today. - continue 81mg  ASA and BB.   2. HTN - BP has been 96/51 - 135/69 in the past 24 hours. - continue PTA Atenolol.   3. Mild Aortic Stenosis - noted on echocardiogram this admission. Valve area (VTI): 1.91 cm^2. Valve area (Vmax): 1.62 cm^2. Valve area (Vmean): 1.6 cm^2. - will need continued follow-up as an outpatient.   4. SIRS secondary to CAP/ COPD Exacerbation - initial labs showed a WBC of  13.0 and Lactic Acid of 3.0. CXR with interstitial prominence suggesting atypical infection, less likely pulmonary edema.  - currently on Levaquin and receiving Solu-Medrol.  - per admitting team  Signed, Ellsworth Lennox, PA-C 03/11/2016, 10:46 AM Pager: [phone] Patient seen and examined and history reviewed. Agree with above findings and plan. Very pleasant 82 yo WF with no prior cardiac history presents with increasing dyspnea and fever. She has evidence of SIRS with CAP and COPD exacerbation. Elevated WBC and  lactic acid. Troponin noted to be elevated. Patient denies any chest pain On exam she is normotensive. NSR. Lungs with bilateral crackles. CV exam with grade 2/6 systolic ejection murmur RUSB. No edema Troponin levels .30, .47, .41 Ecg is normal Echo shows normal LV function. Mild AS. Pulmonary HTN  Impression:  1. Demand ischemia in setting of CAP, SIRS, hypoxia, and anemia. Troponin levels with flat trend not consistent with ACS. No angina. Normal Ecg and LV function. Would recommend continued ASA and atenolol. No further cardiac work up recommended. 2. Mild aortic stenosis. Unlikely to cause problems at her advanced age.  3. Pulmonary HTN most likely secondary to COPD.  Please call if there are other cardiac questions.  Peter Sw[place], MDFACC 03/11/2016 12:52 PM

## 2016-03-11 NOTE — Evaluation (Signed)
Occupational Therapy Evaluation Patient Details Name: Alexis Andrews MRN: [id number] DOB: 01/14/1927 Today's Date: 03/11/2016    History of Present Illness Alexis Andrews is a 82 y.o. female with medical history significant of COPD, hypertension, rheumatoid arthritis, who presents with cough, shortness of breath and fever. Chest x-ray in ED suggestive of atypical infection. Of note, pt recently finished course of Cipro for UTI.   Clinical Impression   Pt admitted with above. She demonstrates the below listed deficits and will benefit from continued OT to maximize safety and independence with BADLs.  Pt presents to OT with generalized weakness, impaired balance, and long standing deficits from RA.  She lives with daughter, how is very supportive.  She currently requires min A, overall for ADLs.  Will follow acutely, but no follow up OT recommended at discharge.       Follow Up Recommendations  No OT follow up;Supervision/Assistance - 24 hour    Equipment Recommendations  None recommended by OT    Recommendations for Other Services       Precautions / Restrictions Precautions Precautions: Other (comment) Precaution Comments: monitor O2 sats      Mobility Bed Mobility Overal bed mobility: Modified Independent                Transfers Overall transfer level: Needs assistance Equipment used: Rolling walker (2 wheeled) Transfers: Sit to/from [employer] Transfers Sit to Stand: Min guard         General transfer comment: min-guard for safety from bed and recliner    Balance Overall balance assessment: Needs assistance Sitting-balance support: Feet supported Sitting balance-Leahy Scale: Good     Standing balance support: Bilateral upper extremity supported Standing balance-Leahy Scale: Poor                              ADL Overall ADL's : Needs assistance/impaired Eating/Feeding: Set up   Grooming: Wash/dry hands;Wash/dry face;Oral care;Brushing  hair;Minimal assistance;Standing   Upper Body Bathing: Set up;Sitting   Lower Body Bathing: Minimal assistance;Sit to/from stand   Upper Body Dressing : Minimal assistance;Sitting   Lower Body Dressing: Moderate assistance;Sit to/from stand   Toilet Transfer: Min guard;Ambulation;Comfort height toilet;Grab bars;RW   Toileting- Architect and Hygiene: Min guard;Sit to/from stand       Functional mobility during ADLs: Min guard;Rolling walker General ADL Comments: Daugther is very supportive      Advice worker      Pertinent Vitals/Pain Pain Assessment: No/denies pain     Hand Dominance Right   Extremity/Trunk Assessment Upper Extremity Assessment Upper Extremity Assessment: RUE deficits/detail;LUE deficits/detail RUE Deficits / Details: arthritic deformity noted bil.  RUE Coordination: decreased fine motor LUE Deficits / Details: arthritic deformity noted bil.    Lower Extremity Assessment Lower Extremity Assessment: Defer to PT evaluation   Cervical / Trunk Assessment Cervical / Trunk Assessment: Kyphotic   Communication Communication Communication: No difficulties   Cognition Arousal/Alertness: Awake/alert Behavior During Therapy: WFL for tasks assessed/performed Overall Cognitive Status: Within Functional Limits for tasks assessed                     General Comments       Exercises       Shoulder Instructions      Home Living Family/patient expects to be discharged to:: Private residence Living Arrangements: Children Available Help at Discharge: Family;Available 24 hours/day Type of Home:  House       Home Layout: Two level Alternate Level Stairs-Number of Steps: 17 Alternate Level Stairs-Rails: Right Bathroom Shower/Tub: Tub/shower unit Shower/tub characteristics: Curtain Firefighter: Standard Bathroom Accessibility: Yes   Home Equipment: Environmental consultant - 2 wheels;Wheelchair - manual;Shower seat;Hand  held shower head   Additional Comments: pt was placed on O2 10 days ago, no history of needing it before that.       Prior Functioning/Environment Level of Independence: Needs assistance  Gait / Transfers Assistance Needed: mod I ADL's / Homemaking Assistance Needed: Pt requires assist for shower, assist to don socks and shoes    Comments: pt was able to go up and down stairs to bedroom 4-5x/ day without trouble before getting sick. Has needed assistance since becoming sick    OT Diagnosis: Generalized weakness   OT Problem List: Decreased strength;Decreased activity tolerance;Impaired balance (sitting and/or standing);Decreased knowledge of use of DME or AE   OT Treatment/Interventions: Self-care/ADL training;DME and/or AE instruction;Therapeutic activities;Patient/family education;Balance training    OT Goals(Current goals can be found in the care plan section) Acute Rehab OT Goals Patient Stated Goal: return home OT Goal Formulation: With patient/family Time For Goal Achievement: 03/25/16 Potential to Achieve Goals: Good ADL Goals Pt Will Perform Grooming: with supervision;standing Pt Will Transfer to Toilet: with supervision;ambulating;regular height toilet;bedside commode;grab bars Pt Will Perform Toileting - Clothing Manipulation and hygiene: with supervision;sit to/from stand  OT Frequency: Min 2X/week   Barriers to D/C:            Co-evaluation              End of Session Equipment Utilized During Treatment: Rolling walker;Oxygen Nurse Communication: Mobility status  Activity Tolerance: Patient tolerated treatment well Patient left: in bed;with call bell/phone within reach;with family/visitor present   Time: 1610-9604 OT Time Calculation (min): 26 min Charges:  OT General Charges $OT Visit: 1 Procedure OT Evaluation $OT Eval Low Complexity: 1 Procedure OT Treatments $Self Care/Home Management : 8-22 mins G-Codes:    [name] M 03-31-16, 6:10  PM

## 2016-03-11 NOTE — Care Management Note (Signed)
Case Management Note  Patient Details  Name: Alexis Andrews MRN: [id number] Date of Birth: 26-Apr-1927  Subjective/Objective:    Admitted with COPD                Action/Plan: Patient lives at home with her daughter who is a Engineer, civil (consulting); PCP is Dr Pete Glatter; private insurance with Medicare/BCBS with prescription drug coverage; pharmacy of choice is [facility]; daughter reports no problem getting her medication. DME - she has home oxygen, rolater, nebulizer machine; no needs identified at this time; CM will continue to follow for DCP  Expected Discharge Date:    possibly 03/12/2018              Expected Discharge Plan:  Home w Home Health Services  Discharge planning Services  CM Consult Choice offered to:  Adult Children, Patient   HH Arranged:  PT HH Agency:  [facility]  Status of Service:  In process, will continue to follow  Cherrie Distance, RN ,MHA,BSN [phone] 03/11/2016, 2:08 PM

## 2016-03-12 ENCOUNTER — Other Ambulatory Visit: Payer: Self-pay

## 2016-03-12 DIAGNOSIS — J189 Pneumonia, unspecified organism: Secondary | ICD-10-CM

## 2016-03-12 LAB — CBC
HEMATOCRIT: 30.4 % — AB (ref 36.0–46.0)
HEMOGLOBIN: 9.2 g/dL — AB (ref 12.0–15.0)
MCH: 29.7 pg (ref 26.0–34.0)
MCHC: 30.3 g/dL (ref 30.0–36.0)
MCV: 98.1 fL (ref 78.0–100.0)
Platelets: 321 10*3/uL (ref 150–400)
RBC: 3.1 MIL/uL — AB (ref 3.87–5.11)
RDW: 12.6 % (ref 11.5–15.5)
WBC: 11 10*3/uL — ABNORMAL HIGH (ref 4.0–10.5)

## 2016-03-12 LAB — BASIC METABOLIC PANEL
ANION GAP: 8 (ref 5–15)
BUN: 13 mg/dL (ref 6–20)
CHLORIDE: 93 mmol/L — AB (ref 101–111)
CO2: 28 mmol/L (ref 22–32)
CREATININE: 0.91 mg/dL (ref 0.44–1.00)
Calcium: 8.6 mg/dL — ABNORMAL LOW (ref 8.9–10.3)
GFR calc non Af Amer: 55 mL/min — ABNORMAL LOW (ref >60)
Glucose, Bld: 146 mg/dL — ABNORMAL HIGH (ref 65–99)
POTASSIUM: 4.4 mmol/L (ref 3.5–5.1)
SODIUM: 129 mmol/L — AB (ref 135–145)

## 2016-03-12 MED ORDER — PREDNISONE 10 MG PO TABS: ORAL_TABLET | ORAL | 0 refills | Status: DC

## 2016-03-12 MED ORDER — ALBUTEROL SULFATE (2.5 MG/3ML) 0.083% IN NEBU: 2.5000 mg | INHALATION_SOLUTION | RESPIRATORY_TRACT | 0 refills | Status: AC | PRN

## 2016-03-12 MED ORDER — GUAIFENESIN ER 600 MG PO TB12
1200.0000 mg | ORAL_TABLET | Freq: Two times a day (BID) | ORAL | Status: DC
  Administered 2016-03-12: 1200 mg via ORAL
  Filled 2016-03-12: qty 2

## 2016-03-12 MED ORDER — GUAIFENESIN ER 600 MG PO TB12: 1200.0000 mg | ORAL_TABLET | Freq: Two times a day (BID) | ORAL | 0 refills | Status: DC

## 2016-03-12 MED ORDER — BETHANECHOL CHLORIDE 25 MG PO TABS
25.0000 mg | ORAL_TABLET | Freq: Three times a day (TID) | ORAL | Status: DC
  Administered 2016-03-12: 25 mg via ORAL
  Filled 2016-03-12 (×2): qty 1

## 2016-03-12 MED ORDER — ALBUTEROL SULFATE HFA 108 (90 BASE) MCG/ACT IN AERS: 2.0000 | INHALATION_SPRAY | RESPIRATORY_TRACT | 0 refills | Status: AC | PRN

## 2016-03-12 MED ORDER — BETHANECHOL CHLORIDE 25 MG PO TABS: 25.0000 mg | ORAL_TABLET | Freq: Three times a day (TID) | ORAL | 0 refills | Status: AC

## 2016-03-12 MED ORDER — LEVOFLOXACIN 500 MG PO TABS: 500.0000 mg | ORAL_TABLET | Freq: Every day | ORAL | 0 refills | Status: DC

## 2016-03-12 MED ORDER — PREDNISONE 20 MG PO TABS: 40.0000 mg | ORAL_TABLET | Freq: Every day | ORAL | Status: DC

## 2016-03-12 NOTE — Discharge Summary (Signed)
PATIENT DETAILS Name: Alexis Andrews Age: 82 y.o. Sex: female Date of Birth: [birth year] MRN: [id number]. Admitting Physician: Lorretta Harp, MD WUJ:[id number],NFA Maisie Fus, MD  Admit Date: 03/10/2016 Discharge date: 03/12/2016  Recommendations for Outpatient Follow-up:  1. Follow up with PCP in 1 weeks 2. Please obtain BMP/CBC in one week-has developed mild hyponatremia 3. Referre to urology if needed (see below) 4. Please follow up on the following pending results: Blood cultures until final  Admitted From:  Home  Disposition: Home with home health services   Home Health: Yes  Equipment/Devices: Oxygen 2 L/m- prior to this hospitalization  Discharge Condition: Stable  CODE STATUS: DNI  Diet recommendation:  Regula  Brief Summary: See H&P, Labs, Consult and Test reports for all details in brief, Patient is a 82 y.o. female with history of COPD-recently started on O2, history of rheumatoid arthritis on Golimumab injection presented with probable COPD exacerbation and atypical pneumonia. Significantly improved with intravenous steroids, as empiric IV antibiotics and bronchodilators. Please see below for further details.   Brief Hospital Course: COPD exacerbation: Significantly improved with intravenous steroids, empiric levofloxacin and scheduled bronchodilators. By day of discharge, lungs are clear, with hardly any rhonchi.  Probably provoked by atypical pneumonia. We will prescribe her a course of tapering prednisone, and continue with her usual bronchodilator regimen on discharge. Family requested rescue inhaler and rescue albuterol nebulizer which have been prescribed as well. Follow up with PCP as needed.  Active Problems: SIRS secondary to Community-acquired pneumonia: X-ray findings highly suggestive of atypical pneumonitis, she was managed with empiric levofloxacin.  Respiratory virus panel negative, urine Legionella and streptococcal antigen negative,  Blood cultures  negative as well. She is afebrile and clinically significantly improved. She is being discharged home on a few more days of levofloxacin. I do not think she has sepsis at this time-suspect elevated lactic acid likely from hypoxia rather than sepsis.  Elevated troponin: No chest pain-trend is flat-not consistent with ACS. Likely demand ischemia in the setting of hypoxia and pneumonia. 2-D echocardiogram did not show any significant wall motion abnormalities, EF was preserved. Seen by cardiology with no further recommendations.   Hyponatremia: Mild and asymptomatic-she appears euvolemic-her urine osmolality is at 220, and significantly lower than a serum osmolality of 275. I have suggested that the family limit excessive free water/fluid intake. TSH within normal limits. Please repeat chemistry panel in 1 week.   Anemia: Appears to have chronic anemia at baseline, suspect drop in hemoglobin secondary to IV fluids and acute illness. No overt evidence of blood loss, continue to follow CBC periodically at PCPs discretion.  Urinary retention: 1 day prior to discharge-patient started having symptoms of urinary frequency-a post void residual this morning is more than 800 mL. Case was discussed with urology on-call Dr Josephina Shih suggested that we try Bethanecol for total of 5 days, and due in/out catheterization twice a day for the next 5 days. If symptoms persist, then family to make appointment with urology. This plan was discussed with the patient's daughter-who is a retired Charity fundraiser, and she is agreeable.  Hypertension: Controlled, continue atenolol  History of rheumatoid arthritis: Currently stable-resume Golimumab injections when she has recovered from pneumonia  Deconditioning/generalized weakness: Likely secondary to acute illness, she recently also had a UTI. PT evaluation completed, plans are for home health services on discharge  Chronic hypoxic respiratory failure: Recently started on home O2 by  PCP, continue on discharge.  Procedures/Studies: Echo 8/29>> EF 60-65%, grade 2 diastolic dysfunction, mild aortic stenosis, PA  peak pressure of 58 mmHg  Discharge Diagnoses:  Principal Problem:   Acute on chronic respiratory failure with hypoxia (HCC) Active Problems:   Elevated troponin   Hypertension   COPD exacerbation (HCC)   SIRs   Hyponatremia   RA (rheumatoid arthritis) (HCC)   Mild aortic stenosis   Demand ischemia [facility])   Urinary Retention  Discharge Instructions:  Activity:  As tolerated with Full fall precautions use walker/cane & assistance as needed   Discharge Instructions    Call MD for:  persistant nausea and vomiting    Complete by:  As directed   Call MD for:  severe uncontrolled pain    Complete by:  As directed   Call MD for:  temperature >100.4    Complete by:  As directed   Diet general    Complete by:  As directed   Increase activity slowly    Complete by:  As directed       Medication List    TAKE these medications   albuterol (2.5 MG/3ML) 0.083% nebulizer solution Commonly known as:  PROVENTIL Take 3 mLs (2.5 mg total) by nebulization every 2 (two) hours as needed for wheezing or shortness of breath.   albuterol 108 (90 Base) MCG/ACT inhaler Commonly known as:  PROVENTIL HFA;VENTOLIN HFA Inhale 2 puffs into the lungs every 4 (four) hours as needed for wheezing or shortness of breath.   aspirin 81 MG tablet Take 81 mg by mouth daily.   atenolol 25 MG tablet Commonly known as:  TENORMIN Take 25 mg by mouth daily.   bethanechol 25 MG tablet Commonly known as:  URECHOLINE Take 1 tablet (25 mg total) by mouth 3 (three) times daily.   guaiFENesin 600 MG 12 hr tablet Commonly known as:  MUCINEX Take 2 tablets (1,200 mg total) by mouth 2 (two) times daily.   levofloxacin 500 MG tablet Commonly known as:  LEVAQUIN Take 1 tablet (500 mg total) by mouth daily.   loratadine 10 MG tablet Commonly known as:  CLARITIN Take 10 mg by mouth  daily.   predniSONE 10 MG tablet Commonly known as:  DELTASONE Take 4 tablets (40 mg) daily for 2 days, then, Take 3 tablets (30 mg) daily for 2 days, then, Take 2 tablets (20 mg) daily for 2 days, then, Take 1 tablets (10 mg) daily for 1 days, then stop   SIMPONI Hartley Inject into the skin See admin instructions. Every 8 weeks   tiotropium 18 MCG inhalation capsule Commonly known as:  SPIRIVA Place 18 mcg into inhaler and inhale daily.      Follow-up Information    [facility] .   Why:  They will do your home health physical therapy at your home Contact information: [address] [place] [address] [phone]        Ginette Otto, MD. Schedule an appointment as soon as possible for a visit in 1 week(s).   Specialty:  Internal Medicine Contact information: [address] [place] [address] [phone]        Chelsea Aus, MD .   Specialty:  Urology Why:  Call if needed to make an appointment if patient continues to have retention issues. Contact information: [address] [place] [address] [phone]          Allergies  Allergen Reactions  . Betadine [Povidone Iodine] Rash  . Keflex [Cephalexin] Itching and Rash    Consultations:   cardiology  Other Procedures/Studies: Dg Chest 2 View  Result Date: 03/10/2016 CLINICAL DATA:  Shortness of breath tonight. History of COPD, hypertension, rheumatoid arthritis. EXAM: CHEST  2 VIEW COMPARISON:  Chest radiograph February 28, 2016 FINDINGS: The cardiomediastinal silhouette is normal, mildly calcified aortic knob. Diffuse interstitial prominence increased from prior radiograph without pleural effusion or focal consolidation. Increased lung volumes with flattened hemidiaphragms. Mild apical pleural thickening. No pneumothorax. Osteopenia. IMPRESSION: Increasing interstitial prominence suggests atypical infection, less likely pulmonary edema.  Electronically Signed   By: Awilda Metro M.D.   On: 03/10/2016 02:07   Dg Chest 2 View  Result Date: 02/28/2016 CLINICAL DATA:  COPD, several weeks of fatigue and weakness, history of hypertension, COPD, rheumatoid arthritis, former smoker. EXAM: CHEST  2 VIEW COMPARISON:  Chest x-ray of June 19, 2014 FINDINGS: The lungs are mildly hyperinflated. The interstitial markings are coarse though stable. There is no alveolar infiltrate or pleural effusion. No pulmonary masses are observed. There is biapical pleural thickening which is stable. The heart and pulmonary vascularity are normal. There is calcification in the wall of the aortic arch. There is multilevel degenerative disc disease of the thoracic spine. IMPRESSION: COPD. There is no pneumonia nor other acute cardiopulmonary abnormality. Aortic atherosclerosis. Electronically Signed   By: David  [place] M.D.   On: 02/28/2016 13:55      TODAY-DAY OF DISCHARGE:  Subjective:   Alexis Andrews today has no headache,no chest abdominal pain,no new weakness tingling or numbness, feels much better wants to go home today.   Objective:   Blood pressure 121/78, pulse 69, temperature 97.9 F (36.6 C), temperature source Oral, resp. rate 18, height 5' 1.5" (1.562 m), weight 62.6 kg (138 lb), SpO2 100 %.  Intake/Output Summary (Last 24 hours) at 03/12/16 1129 Last data filed at 03/12/16 1056  Gross per 24 hour  Intake              940 ml  Output             2345 ml  Net            -1405 ml   Filed Weights   03/10/16 0512 03/11/16 0401 03/12/16 0524  Weight: 62.6 kg (138 lb 1.6 oz) 62.6 kg (137 lb 14.4 oz) 62.6 kg (138 lb)    Exam: Awake Alert, Oriented *3, No new F.N deficits, Normal affect [place].AT,PERRAL Supple Neck,No JVD, No cervical lymphadenopathy appriciated.  Symmetrical Chest wall movement, Good air movement bilaterally, CTAB RRR,No Gallops,Rubs or new Murmurs, No Parasternal Heave +ve B.Sounds, Abd Soft, Non tender, No organomegaly  appriciated, No rebound -guarding or rigidity. No Cyanosis, Clubbing or edema, No new Rash or bruise   PERTINENT RADIOLOGIC STUDIES: Dg Chest 2 View  Result Date: 03/10/2016 CLINICAL DATA:  Shortness of breath tonight. History of COPD, hypertension, rheumatoid arthritis. EXAM: CHEST  2 VIEW COMPARISON:  Chest radiograph February 28, 2016 FINDINGS: The cardiomediastinal silhouette is normal, mildly calcified aortic knob. Diffuse interstitial prominence increased from prior radiograph without pleural effusion or focal consolidation. Increased lung volumes with flattened hemidiaphragms. Mild apical pleural thickening. No pneumothorax. Osteopenia. IMPRESSION: Increasing interstitial prominence suggests atypical infection, less likely pulmonary edema. Electronically Signed   By: Awilda Metro M.D.   On: 03/10/2016 02:07   Dg Chest 2 View  Result Date: 02/28/2016 CLINICAL DATA:  COPD, several weeks of fatigue and weakness, history of hypertension, COPD, rheumatoid arthritis, former smoker. EXAM: CHEST  2 VIEW COMPARISON:  Chest x-ray of June 19, 2014 FINDINGS: The lungs  are mildly hyperinflated. The interstitial markings are coarse though stable. There is no alveolar infiltrate or pleural effusion. No pulmonary masses are observed. There is biapical pleural thickening which is stable. The heart and pulmonary vascularity are normal. There is calcification in the wall of the aortic arch. There is multilevel degenerative disc disease of the thoracic spine. IMPRESSION: COPD. There is no pneumonia nor other acute cardiopulmonary abnormality. Aortic atherosclerosis. Electronically Signed   By: David  [place] M.D.   On: 02/28/2016 13:55     PERTINENT LAB RESULTS: CBC:  Recent Labs  03/11/16 0348 03/12/16 0316  WBC 13.9* 11.0*  HGB 8.6* 9.2*  HCT 27.9* 30.4*  PLT 271 321   CMET CMP     Component Value Date/Time   NA 129 (L) 03/12/2016 0316   K 4.4 03/12/2016 0316   CL 93 (L) 03/12/2016 0316    CO2 28 03/12/2016 0316   GLUCOSE 146 (H) 03/12/2016 0316   BUN 13 03/12/2016 0316   CREATININE 0.91 03/12/2016 0316   CALCIUM 8.6 (L) 03/12/2016 0316   PROT 7.6 03/10/2016 0148   ALBUMIN 2.8 (L) 03/10/2016 0148   AST 24 03/10/2016 0148   ALT 10 (L) 03/10/2016 0148   ALKPHOS 57 03/10/2016 0148   BILITOT 0.3 03/10/2016 0148   GFRNONAA 55 (L) 03/12/2016 0316   GFRAA >60 03/12/2016 0316    GFR Estimated Creatinine Clearance: 37.4 mL/min (by C-G formula based on SCr of 0.91 mg/dL). No results for input(s): LIPASE, AMYLASE in the last 72 hours.  Recent Labs  03/10/16 0438 03/10/16 0934 03/10/16 1611  TROPONINI 0.41* 0.47* 0.30*   Invalid input(s): POCBNP No results for input(s): DDIMER in the last 72 hours.  Recent Labs  03/10/16 0438  HGBA1C 6.2*    Recent Labs  03/10/16 0438  CHOL 187  HDL 52  LDLCALC 118*  TRIG 86  CHOLHDL 3.6    Recent Labs  03/10/16 0438  TSH 2.126   No results for input(s): VITAMINB12, FOLATE, FERRITIN, TIBC, IRON, RETICCTPCT in the last 72 hours. Coags:  Recent Labs  03/10/16 0438  INR 1.07   Microbiology: Recent Results (from the past 240 hour(s))  Culture, blood (routine x 2) Call MD if unable to obtain prior to antibiotics being given     Status: None (Preliminary result)   Collection Time: 03/10/16  1:48 AM  Result Value Ref Range Status   Specimen Description BLOOD LEFT ARM  Final   Special Requests BOTTLES DRAWN AEROBIC AND ANAEROBIC 5CC EA  Final   Culture NO GROWTH 1 DAY  Final   Report Status PENDING  Incomplete  Urine culture     Status: None   Collection Time: 03/10/16  3:10 AM  Result Value Ref Range Status   Specimen Description URINE, CLEAN CATCH  Final   Special Requests NONE  Final   Culture NO GROWTH  Final   Report Status 03/11/2016 FINAL  Final  Respiratory Panel by PCR     Status: None   Collection Time: 03/10/16  4:38 AM  Result Value Ref Range Status   Adenovirus NOT DETECTED NOT DETECTED Final    Coronavirus 229E NOT DETECTED NOT DETECTED Final   Coronavirus HKU1 NOT DETECTED NOT DETECTED Final   Coronavirus NL63 NOT DETECTED NOT DETECTED Final   Coronavirus OC43 NOT DETECTED NOT DETECTED Final   Metapneumovirus NOT DETECTED NOT DETECTED Final   Rhinovirus / Enterovirus NOT DETECTED NOT DETECTED Final   Influenza A NOT DETECTED NOT DETECTED Final  Influenza B NOT DETECTED NOT DETECTED Final   Parainfluenza Virus 1 NOT DETECTED NOT DETECTED Final   Parainfluenza Virus 2 NOT DETECTED NOT DETECTED Final   Parainfluenza Virus 3 NOT DETECTED NOT DETECTED Final   Parainfluenza Virus 4 NOT DETECTED NOT DETECTED Final   Respiratory Syncytial Virus NOT DETECTED NOT DETECTED Final   Bordetella pertussis NOT DETECTED NOT DETECTED Final   Chlamydophila pneumoniae NOT DETECTED NOT DETECTED Final   Mycoplasma pneumoniae NOT DETECTED NOT DETECTED Final  Culture, blood (routine x 2) Call MD if unable to obtain prior to antibiotics being given     Status: None (Preliminary result)   Collection Time: 03/10/16  4:38 AM  Result Value Ref Range Status   Specimen Description BLOOD RIGHT ANTECUBITAL  Final   Special Requests BOTTLES DRAWN AEROBIC AND ANAEROBIC 5CC EA  Final   Culture NO GROWTH 1 DAY  Final   Report Status PENDING  Incomplete    FURTHER DISCHARGE INSTRUCTIONS:  Get Medicines reviewed and adjusted: Please take all your medications with you for your next visit with your Primary MD  Laboratory/radiological data: Please request your Primary MD to go over all hospital tests and procedure/radiological results at the follow up, please ask your Primary MD to get all Hospital records sent to his/her office.  In some cases, they will be blood work, cultures and biopsy results pending at the time of your discharge. Please request that your primary care M.D. goes through all the records of your hospital data and follows up on these results.  Also Note the following: If you experience  worsening of your admission symptoms, develop shortness of breath, life threatening emergency, suicidal or homicidal thoughts you must seek medical attention immediately by calling 911 or calling your MD immediately  if symptoms less severe.  You must read complete instructions/literature along with all the possible adverse reactions/side effects for all the Medicines you take and that have been prescribed to you. Take any new Medicines after you have completely understood and accpet all the possible adverse reactions/side effects.   Do not drive when taking Pain medications or sleeping medications (Benzodaizepines)  Do not take more than prescribed Pain, Sleep and Anxiety Medications. It is not advisable to combine anxiety,sleep and pain medications without talking with your primary care practitioner  Special Instructions: If you have smoked or chewed Tobacco  in the last 2 yrs please stop smoking, stop any regular Alcohol  and or any Recreational drug use.  Wear Seat belts while driving.  Please note: You were cared for by a hospitalist during your hospital stay. Once you are discharged, your primary care physician will handle any further medical issues. Please note that NO REFILLS for any discharge medications will be authorized once you are discharged, as it is imperative that you return to your primary care physician (or establish a relationship with a primary care physician if you do not have one) for your post hospital discharge needs so that they can reassess your need for medications and monitor your lab values.  Total Time spent coordinating discharge including counseling, education and face to face time equals 45 minutes.  SignedJeoffrey Massed 03/12/2016 11:29 AM

## 2016-03-12 NOTE — Progress Notes (Signed)
Pt states she has been having lower abdominal fullness and not urinating much during the night and this morning. Family at bedside. Bladder scan performed and showed >859cc. MD notified. In & out cath done with 875cc output.

## 2016-03-12 NOTE — Consult Note (Addendum)
   [facility] CM Inpatient Consult   03/12/2016  Alexis Andrews [birth year] [id number]  Patient screened for potential [place] Management services. Patient is eligible for [facility] Care Management services under patient's Medicare  Plan.  Chart review reveals that the patient had been assigned [facility] EMMI calls for follow up.  Met with patient and daughter [name], [nurse] regarding benefits of [facility] Care Management.  She states she will be fine with the phone calls but was interested in the community resources.  She signed consent for care management services and preferred after home health is finishing up their work.  [facility] Care Management package given with contact information.   Patient will go home with [place]   Please place a [facility] Care Management consult or for questions contact:   Natividad Brood, RN BSN [facility] Liaison  [phone] business mobile phone Toll free office [phone]  Please call daughter's cell phone for best contact: [phone].   Natividad Brood, RN BSN [facility] Liaison  [phone] business mobile phone Toll free office [phone]

## 2016-03-12 NOTE — Progress Notes (Signed)
CM talked to [facility] with [facility], they cannot accept the patient for [facility] services; CM talked to patient with daughter at the bedside for [facility] choices, daughter chose [place]; Mary with [name] called for arrangements. Abelino Derrick [facility] [phone]

## 2016-03-12 NOTE — Progress Notes (Signed)
Pt has orders to be discharged. Discharge instructions given and pt has no additional questions at this time. Medication regimen reviewed and pt educated. Pt verbalized understanding and has no additional questions. Telemetry box removed. IV removed and site in good condition. Pt stable and waiting for transportation. 

## 2016-03-13 ENCOUNTER — Emergency Department (HOSPITAL_COMMUNITY): Payer: Medicare Other

## 2016-03-13 ENCOUNTER — Encounter (HOSPITAL_COMMUNITY): Payer: Self-pay | Admitting: Emergency Medicine

## 2016-03-13 ENCOUNTER — Inpatient Hospital Stay (HOSPITAL_COMMUNITY): Payer: Medicare Other

## 2016-03-13 ENCOUNTER — Inpatient Hospital Stay (HOSPITAL_COMMUNITY)
Admission: EM | Admit: 2016-03-13 | Discharge: 2016-03-19 | DRG: 304 | Disposition: A | Discharge status: Home Health | Payer: Medicare Other | Attending: Internal Medicine | Admitting: Internal Medicine

## 2016-03-13 DIAGNOSIS — G9340 Encephalopathy, unspecified: Secondary | ICD-10-CM | POA: Diagnosis present

## 2016-03-13 DIAGNOSIS — E222 Syndrome of inappropriate secretion of antidiuretic hormone: Secondary | ICD-10-CM | POA: Diagnosis present

## 2016-03-13 DIAGNOSIS — R339 Retention of urine, unspecified: Secondary | ICD-10-CM | POA: Diagnosis present

## 2016-03-13 DIAGNOSIS — H53132 Sudden visual loss, left eye: Secondary | ICD-10-CM | POA: Diagnosis not present

## 2016-03-13 DIAGNOSIS — H5704 Mydriasis: Secondary | ICD-10-CM | POA: Diagnosis present

## 2016-03-13 DIAGNOSIS — T502X5A Adverse effect of carbonic-anhydrase inhibitors, benzothiadiazides and other diuretics, initial encounter: Secondary | ICD-10-CM | POA: Diagnosis present

## 2016-03-13 DIAGNOSIS — M069 Rheumatoid arthritis, unspecified: Secondary | ICD-10-CM | POA: Diagnosis present

## 2016-03-13 DIAGNOSIS — R29818 Other symptoms and signs involving the nervous system: Secondary | ICD-10-CM | POA: Diagnosis not present

## 2016-03-13 DIAGNOSIS — J189 Pneumonia, unspecified organism: Secondary | ICD-10-CM | POA: Diagnosis present

## 2016-03-13 DIAGNOSIS — D638 Anemia in other chronic diseases classified elsewhere: Secondary | ICD-10-CM | POA: Diagnosis present

## 2016-03-13 DIAGNOSIS — I10 Essential (primary) hypertension: Secondary | ICD-10-CM | POA: Diagnosis not present

## 2016-03-13 DIAGNOSIS — H269 Unspecified cataract: Secondary | ICD-10-CM | POA: Diagnosis present

## 2016-03-13 DIAGNOSIS — I110 Hypertensive heart disease with heart failure: Secondary | ICD-10-CM | POA: Diagnosis present

## 2016-03-13 DIAGNOSIS — Z87891 Personal history of nicotine dependence: Secondary | ICD-10-CM | POA: Diagnosis not present

## 2016-03-13 DIAGNOSIS — H5710 Ocular pain, unspecified eye: Secondary | ICD-10-CM | POA: Diagnosis present

## 2016-03-13 DIAGNOSIS — Z7982 Long term (current) use of aspirin: Secondary | ICD-10-CM

## 2016-03-13 DIAGNOSIS — H409 Unspecified glaucoma: Secondary | ICD-10-CM | POA: Diagnosis present

## 2016-03-13 DIAGNOSIS — H538 Other visual disturbances: Secondary | ICD-10-CM | POA: Diagnosis not present

## 2016-03-13 DIAGNOSIS — E871 Hypo-osmolality and hyponatremia: Secondary | ICD-10-CM | POA: Diagnosis present

## 2016-03-13 DIAGNOSIS — E876 Hypokalemia: Secondary | ICD-10-CM | POA: Diagnosis present

## 2016-03-13 DIAGNOSIS — R51 Headache: Secondary | ICD-10-CM | POA: Diagnosis not present

## 2016-03-13 DIAGNOSIS — I5189 Other ill-defined heart diseases: Secondary | ICD-10-CM | POA: Diagnosis present

## 2016-03-13 DIAGNOSIS — Z66 Do not resuscitate: Secondary | ICD-10-CM | POA: Diagnosis present

## 2016-03-13 DIAGNOSIS — K5900 Constipation, unspecified: Secondary | ICD-10-CM | POA: Diagnosis not present

## 2016-03-13 DIAGNOSIS — R0602 Shortness of breath: Secondary | ICD-10-CM

## 2016-03-13 DIAGNOSIS — J449 Chronic obstructive pulmonary disease, unspecified: Secondary | ICD-10-CM | POA: Diagnosis present

## 2016-03-13 DIAGNOSIS — I519 Heart disease, unspecified: Secondary | ICD-10-CM | POA: Diagnosis not present

## 2016-03-13 DIAGNOSIS — I160 Hypertensive urgency: Secondary | ICD-10-CM | POA: Diagnosis present

## 2016-03-13 DIAGNOSIS — H40212 Acute angle-closure glaucoma, left eye: Secondary | ICD-10-CM | POA: Diagnosis not present

## 2016-03-13 DIAGNOSIS — R030 Elevated blood-pressure reading, without diagnosis of hypertension: Secondary | ICD-10-CM | POA: Diagnosis not present

## 2016-03-13 DIAGNOSIS — I5030 Unspecified diastolic (congestive) heart failure: Secondary | ICD-10-CM | POA: Diagnosis present

## 2016-03-13 DIAGNOSIS — G44209 Tension-type headache, unspecified, not intractable: Secondary | ICD-10-CM | POA: Diagnosis not present

## 2016-03-13 DIAGNOSIS — R519 Headache, unspecified: Secondary | ICD-10-CM

## 2016-03-13 DIAGNOSIS — H4050X0 Glaucoma secondary to other eye disorders, unspecified eye, stage unspecified: Secondary | ICD-10-CM | POA: Diagnosis present

## 2016-03-13 LAB — URINE MICROSCOPIC-ADD ON: RBC / HPF: NONE SEEN RBC/hpf (ref 0–5)

## 2016-03-13 LAB — URINALYSIS, ROUTINE W REFLEX MICROSCOPIC
Bilirubin Urine: NEGATIVE
Glucose, UA: 100 mg/dL — AB
Hgb urine dipstick: NEGATIVE
Ketones, ur: NEGATIVE mg/dL
LEUKOCYTES UA: NEGATIVE
NITRITE: NEGATIVE
PH: 7.5 (ref 5.0–8.0)
Protein, ur: 30 mg/dL — AB
SPECIFIC GRAVITY, URINE: 1.02 (ref 1.005–1.030)

## 2016-03-13 LAB — CBC WITH DIFFERENTIAL/PLATELET
BASOS ABS: 0 10*3/uL (ref 0.0–0.1)
Basophils Relative: 0 %
EOS PCT: 0 %
Eosinophils Absolute: 0 10*3/uL (ref 0.0–0.7)
HEMATOCRIT: 32.9 % — AB (ref 36.0–46.0)
Hemoglobin: 10.4 g/dL — ABNORMAL LOW (ref 12.0–15.0)
LYMPHS ABS: 1.3 10*3/uL (ref 0.7–4.0)
LYMPHS PCT: 9 %
MCH: 30.4 pg (ref 26.0–34.0)
MCHC: 31.6 g/dL (ref 30.0–36.0)
MCV: 96.2 fL (ref 78.0–100.0)
MONO ABS: 0.3 10*3/uL (ref 0.1–1.0)
Monocytes Relative: 2 %
NEUTROS ABS: 12.5 10*3/uL — AB (ref 1.7–7.7)
Neutrophils Relative %: 89 %
PLATELETS: 398 10*3/uL (ref 150–400)
RBC: 3.42 MIL/uL — AB (ref 3.87–5.11)
RDW: 12.4 % (ref 11.5–15.5)
WBC: 14 10*3/uL — AB (ref 4.0–10.5)

## 2016-03-13 LAB — I-STAT CHEM 8, ED
BUN: 18 mg/dL (ref 6–20)
Calcium, Ion: 1.07 mmol/L — ABNORMAL LOW (ref 1.15–1.40)
Chloride: 85 mmol/L — ABNORMAL LOW (ref 101–111)
Creatinine, Ser: 0.8 mg/dL (ref 0.44–1.00)
GLUCOSE: 169 mg/dL — AB (ref 65–99)
HEMATOCRIT: 35 % — AB (ref 36.0–46.0)
HEMOGLOBIN: 11.9 g/dL — AB (ref 12.0–15.0)
POTASSIUM: 4.4 mmol/L (ref 3.5–5.1)
SODIUM: 123 mmol/L — AB (ref 135–145)
TCO2: 30 mmol/L (ref 0–100)

## 2016-03-13 LAB — COMPREHENSIVE METABOLIC PANEL
ALK PHOS: 51 U/L (ref 38–126)
ALT: 35 U/L (ref 14–54)
ANION GAP: 9 (ref 5–15)
AST: 42 U/L — ABNORMAL HIGH (ref 15–41)
Albumin: 2.9 g/dL — ABNORMAL LOW (ref 3.5–5.0)
BILIRUBIN TOTAL: 0.6 mg/dL (ref 0.3–1.2)
BUN: 15 mg/dL (ref 6–20)
CALCIUM: 8.5 mg/dL — AB (ref 8.9–10.3)
CO2: 28 mmol/L (ref 22–32)
Chloride: 86 mmol/L — ABNORMAL LOW (ref 101–111)
Creatinine, Ser: 0.85 mg/dL (ref 0.44–1.00)
GFR calc non Af Amer: 60 mL/min — ABNORMAL LOW (ref >60)
Glucose, Bld: 168 mg/dL — ABNORMAL HIGH (ref 65–99)
Potassium: 4.4 mmol/L (ref 3.5–5.1)
SODIUM: 123 mmol/L — AB (ref 135–145)
TOTAL PROTEIN: 7.4 g/dL (ref 6.5–8.1)

## 2016-03-13 LAB — APTT: aPTT: 34 seconds (ref 24–36)

## 2016-03-13 LAB — RAPID URINE DRUG SCREEN, HOSP PERFORMED
Amphetamines: NOT DETECTED
Barbiturates: NOT DETECTED
Benzodiazepines: NOT DETECTED
Cocaine: NOT DETECTED
OPIATES: POSITIVE — AB
Tetrahydrocannabinol: NOT DETECTED

## 2016-03-13 LAB — PROTIME-INR
INR: 1.05
PROTHROMBIN TIME: 13.7 s (ref 11.4–15.2)

## 2016-03-13 LAB — BASIC METABOLIC PANEL
Anion gap: 8 (ref 5–15)
BUN: 13 mg/dL (ref 6–20)
CO2: 24 mmol/L (ref 22–32)
CREATININE: 0.73 mg/dL (ref 0.44–1.00)
Calcium: 8.1 mg/dL — ABNORMAL LOW (ref 8.9–10.3)
Chloride: 89 mmol/L — ABNORMAL LOW (ref 101–111)
GFR calc Af Amer: >60 mL/min (ref >60)
GLUCOSE: 143 mg/dL — AB (ref 65–99)
POTASSIUM: 3.5 mmol/L (ref 3.5–5.1)
Sodium: 121 mmol/L — ABNORMAL LOW (ref 135–145)

## 2016-03-13 LAB — I-STAT TROPONIN, ED: Troponin i, poc: 0.04 ng/mL (ref 0.00–0.08)

## 2016-03-13 MED ORDER — SODIUM CHLORIDE 0.9 % IV BOLUS (SEPSIS)
1000.0000 mL | Freq: Once | INTRAVENOUS | Status: AC
  Administered 2016-03-13: 1000 mL via INTRAVENOUS

## 2016-03-13 MED ORDER — TETRACAINE HCL 0.5 % OP SOLN
1.0000 [drp] | Freq: Once | OPHTHALMIC | Status: AC
  Administered 2016-03-13: 1 [drp] via OPHTHALMIC
  Filled 2016-03-13: qty 2

## 2016-03-13 MED ORDER — BRIMONIDINE TARTRATE 0.15 % OP SOLN
1.0000 [drp] | Freq: Three times a day (TID) | OPHTHALMIC | Status: DC
  Administered 2016-03-13 – 2016-03-19 (×17): 1 [drp] via OPHTHALMIC
  Filled 2016-03-13: qty 5

## 2016-03-13 MED ORDER — ACETAZOLAMIDE SODIUM 500 MG IJ SOLR
500.0000 mg | Freq: Once | INTRAMUSCULAR | Status: AC
  Administered 2016-03-13: 500 mg via INTRAVENOUS
  Filled 2016-03-13: qty 500

## 2016-03-13 MED ORDER — ACETAMINOPHEN 325 MG PO TABS
650.0000 mg | ORAL_TABLET | Freq: Four times a day (QID) | ORAL | Status: DC | PRN
  Administered 2016-03-16 – 2016-03-17 (×2): 650 mg via ORAL
  Filled 2016-03-13 (×2): qty 2

## 2016-03-13 MED ORDER — MORPHINE SULFATE (PF) 4 MG/ML IV SOLN
4.0000 mg | INTRAVENOUS | Status: DC | PRN
  Administered 2016-03-13: 4 mg via INTRAVENOUS
  Filled 2016-03-13: qty 1

## 2016-03-13 MED ORDER — HYDRALAZINE HCL 20 MG/ML IJ SOLN
5.0000 mg | Freq: Four times a day (QID) | INTRAMUSCULAR | Status: DC | PRN
Start: 2016-03-13 — End: 2016-03-19
  Administered 2016-03-13: 5 mg via INTRAVENOUS

## 2016-03-13 MED ORDER — ACETAZOLAMIDE SODIUM 500 MG IJ SOLR
500.0000 mg | Freq: Once | INTRAMUSCULAR | Status: AC
  Administered 2016-03-14: 500 mg via INTRAVENOUS
  Filled 2016-03-13: qty 500

## 2016-03-13 MED ORDER — PILOCARPINE HCL 2 % OP SOLN
1.0000 [drp] | Freq: Four times a day (QID) | OPHTHALMIC | Status: DC
  Administered 2016-03-13 – 2016-03-19 (×23): 1 [drp] via OPHTHALMIC
  Filled 2016-03-13 (×2): qty 15

## 2016-03-13 MED ORDER — SODIUM CHLORIDE 0.9% FLUSH: 3.0000 mL | Freq: Two times a day (BID) | INTRAVENOUS | Status: DC

## 2016-03-13 MED ORDER — HYDRALAZINE HCL 20 MG/ML IJ SOLN
INTRAMUSCULAR | Status: AC
  Filled 2016-03-13: qty 1

## 2016-03-13 MED ORDER — ATENOLOL 25 MG PO TABS
25.0000 mg | ORAL_TABLET | Freq: Every day | ORAL | Status: DC
  Administered 2016-03-14: 25 mg via ORAL
  Filled 2016-03-13 (×2): qty 1

## 2016-03-13 MED ORDER — SODIUM CHLORIDE 0.9 % IV SOLN: 250.0000 mL | INTRAVENOUS | Status: DC | PRN

## 2016-03-13 MED ORDER — ACETAMINOPHEN 650 MG RE SUPP: 650.0000 mg | Freq: Four times a day (QID) | RECTAL | Status: DC | PRN

## 2016-03-13 MED ORDER — DORZOLAMIDE HCL-TIMOLOL MAL 2-0.5 % OP SOLN
1.0000 [drp] | Freq: Three times a day (TID) | OPHTHALMIC | Status: DC
  Administered 2016-03-13 – 2016-03-19 (×17): 1 [drp] via OPHTHALMIC
  Filled 2016-03-13: qty 10

## 2016-03-13 MED ORDER — ONDANSETRON HCL 4 MG/2ML IJ SOLN
4.0000 mg | Freq: Four times a day (QID) | INTRAMUSCULAR | Status: DC | PRN
  Administered 2016-03-13: 4 mg via INTRAVENOUS
  Filled 2016-03-13: qty 2

## 2016-03-13 MED ORDER — ONDANSETRON HCL 4 MG PO TABS: 4.0000 mg | ORAL_TABLET | Freq: Four times a day (QID) | ORAL | Status: DC | PRN

## 2016-03-13 MED ORDER — GUAIFENESIN ER 600 MG PO TB12
1200.0000 mg | ORAL_TABLET | Freq: Two times a day (BID) | ORAL | Status: DC
  Filled 2016-03-13 (×2): qty 2

## 2016-03-13 MED ORDER — ALBUTEROL SULFATE (2.5 MG/3ML) 0.083% IN NEBU
2.5000 mg | INHALATION_SOLUTION | RESPIRATORY_TRACT | Status: DC | PRN
  Administered 2016-03-14 – 2016-03-15 (×3): 2.5 mg via RESPIRATORY_TRACT
  Filled 2016-03-13 (×4): qty 3

## 2016-03-13 MED ORDER — SODIUM CHLORIDE 0.9 % IV BOLUS (SEPSIS)
500.0000 mL | Freq: Once | INTRAVENOUS | Status: AC
  Administered 2016-03-13: 500 mL via INTRAVENOUS

## 2016-03-13 MED ORDER — LEVOFLOXACIN 500 MG PO TABS
500.0000 mg | ORAL_TABLET | Freq: Every day | ORAL | Status: DC
  Administered 2016-03-14 – 2016-03-16 (×3): 500 mg via ORAL
  Filled 2016-03-13 (×3): qty 1

## 2016-03-13 MED ORDER — IOPAMIDOL (ISOVUE-370) INJECTION 76%
INTRAVENOUS | Status: AC
  Administered 2016-03-13: 50 mL
  Filled 2016-03-13: qty 50

## 2016-03-13 MED ORDER — ENOXAPARIN SODIUM 40 MG/0.4ML ~~LOC~~ SOLN
40.0000 mg | SUBCUTANEOUS | Status: DC
  Administered 2016-03-13 – 2016-03-15 (×3): 40 mg via SUBCUTANEOUS
  Filled 2016-03-13 (×3): qty 0.4

## 2016-03-13 MED ORDER — SODIUM CHLORIDE 0.9% FLUSH: 3.0000 mL | INTRAVENOUS | Status: DC | PRN

## 2016-03-13 NOTE — H&P (Signed)
History and Physical    Abbygale Lapid GUY:[id number] DOB: 1926-12-14 DOA: 03/13/2016  PCP: Ginette Otto, MD  Patient coming from: Home  Chief Complaint: Left eye pain and loss of vision  HPI: Dynver Clemson is a 82 y.o. female with medical history significant of chronic obstructive pulmonary disease, hypertension, rheumatoid arthritis, recently hospitalized from 03/10/2016 through 03/12/2016, treated for a COPD exacerbation and community-acquired pneumonia. During that hospitalization she was found to be hyponatremic with a sodium of 129 felt to be secondary to SIADH. She was also found to have urinary retention during that hospitalization seen by urology and given Bethachol on day of discharge. She was otherwise discharged in stable condition. Family members noting that her blood pressures were gradually increasing over the past 24 hours then this morning at approximately 10 am Mrs [facility] reported having pain and blurry vision in her left eye. Family members noted that left pupil was significantly larger compared to her right. She presented as a code stroke.   ED Course: In the emergency department she had a stat CT scan of brain that did not reveal acute intracranial abnormality. CT angiogram did not show large vessel occlusion or intracranial aneurysm. She was seen and evaluated by neurology in the emergency department who did not feel this represented acute CVA. Lab work revealed a sodium of 123, decreased from 129 (03/12/2016). She was also hypertensive having systolic blood pressure of 200.  Review of Systems: As per HPI otherwise 10 point review of systems negative.    Past Medical History:  Diagnosis Date  . COPD (chronic obstructive pulmonary disease) (HCC)   . Depression    "treated months ago; quit taking RX" (03/11/2016)  . Heart murmur   . Hypertension    "on beta blocker for arrhythmia; not for high blood pressure" (03/11/2016)  . On home oxygen therapy    "2L; 24/7 since  02/29/2016" (03/11/2016)  . Pneumonia    "now and many years ago" (03/11/2016)  . Rheumatoid arthritis(714.0)     Past Surgical History:  Procedure Laterality Date  . DILATION AND CURETTAGE OF UTERUS    . EXCISIONAL HEMORRHOIDECTOMY  1960  . HAMMER TOE SURGERY    . TONSILLECTOMY  1946     reports that she has quit smoking. Her smoking use included Cigarettes. She has a 19.50 pack-year smoking history. She has never used smokeless tobacco. She reports that she drinks about 2.4 oz of alcohol per week . Her drug history is not on file.  Allergies  Allergen Reactions  . Betadine [Povidone Iodine] Rash  . Keflex [Cephalexin] Itching and Rash    Family History  Problem Relation Age of Onset  . Lung cancer Brother      Prior to Admission medications   Medication Sig Start Date End Date Taking? Authorizing Provider  albuterol (PROVENTIL HFA;VENTOLIN HFA) 108 (90 Base) MCG/ACT inhaler Inhale 2 puffs into the lungs every 4 (four) hours as needed for wheezing or shortness of breath. 03/12/16  Yes Shanker Levora Dredge, MD  albuterol (PROVENTIL) (2.5 MG/3ML) 0.083% nebulizer solution Take 3 mLs (2.5 mg total) by nebulization every 2 (two) hours as needed for wheezing or shortness of breath. 03/12/16  Yes Maretta Bees, MD  aspirin 81 MG tablet Take 81 mg by mouth daily.   Yes Historical Provider, MD  atenolol (TENORMIN) 25 MG tablet Take 25 mg by mouth daily.   Yes Historical Provider, MD  bethanechol (URECHOLINE) 25 MG tablet Take 1 tablet (25 mg total) by mouth  3 (three) times daily. 03/12/16  Yes Shanker Levora Dredge, MD  Golimumab (SIMPONI [place]) Inject into the skin See admin instructions. Every 8 weeks   Yes Historical Provider, MD  guaiFENesin (MUCINEX) 600 MG 12 hr tablet Take 2 tablets (1,200 mg total) by mouth 2 (two) times daily. 03/12/16  Yes Shanker Levora Dredge, MD  levofloxacin (LEVAQUIN) 500 MG tablet Take 1 tablet (500 mg total) by mouth daily. 03/12/16  Yes Shanker Levora Dredge, MD    loratadine (CLARITIN) 10 MG tablet Take 10 mg by mouth daily.   Yes Historical Provider, MD  predniSONE (DELTASONE) 10 MG tablet Take 4 tablets (40 mg) daily for 2 days, then, Take 3 tablets (30 mg) daily for 2 days, then, Take 2 tablets (20 mg) daily for 2 days, then, Take 1 tablets (10 mg) daily for 1 days, then stop 03/12/16  Yes Maretta Bees, MD  tiotropium (SPIRIVA) 18 MCG inhalation capsule Place 18 mcg into inhaler and inhale daily.   Yes Historical Provider, MD    Physical Exam: Vitals:   03/13/16 1241 03/13/16 1347  BP: 200/91   Pulse: 69   Resp: 23   Temp: 98 F (36.7 C) 98 F (36.7 C)  TempSrc: Oral   SpO2: 97%      Constitutional: NAD, calm, comfortable Vitals:   03/13/16 1241 03/13/16 1347  BP: 200/91   Pulse: 69   Resp: 23   Temp: 98 F (36.7 C) 98 F (36.7 C)  TempSrc: Oral   SpO2: 97%    Eyes: Left pupil measures about 4-5 mm in diameter, not reactive to light, there is decreased visual acuity from this site. Right pupil 2 mm in diameter also having decreased visual acuity. She wears glasses. ENMT: Mucous membranes are moist. Posterior pharynx clear of any exudate or lesions.Normal dentition.  Neck: normal, supple, no masses, no thyromegaly Respiratory: Coarse respiratory sounds, there is bilateral expiratory wheezes.  Cardiovascular: Regular rate and rhythm, no murmurs / rubs / gallops. There was 1+ bilateral lower extremity pitting edema. 2+ pedal pulses. No carotid bruits.  Abdomen: no tenderness, no masses palpated. No hepatosplenomegaly. Bowel sounds positive.  Musculoskeletal: no clubbing / cyanosis. No joint deformity upper and lower extremities. Good ROM, no contractures. Normal muscle tone.  Skin: no rashes, lesions, ulcers. No induration Neurologic: Left-sided mydriasis, has followed a formal strength to bilateral upper and lower extremities no alteration to sensation Psychiatric: She appears mildly confused    Labs on Admission: I have  personally reviewed following labs and imaging studies  CBC:  Recent Labs Lab 03/10/16 0148 03/11/16 0348 03/12/16 0316 03/13/16 1314 03/13/16 1319  WBC 13.0* 13.9* 11.0* 14.0*  --   NEUTROABS 11.5*  --   --  12.5*  --   HGB 10.9* 8.6* 9.2* 10.4* 11.9*  HCT 35.3* 27.9* 30.4* 32.9* 35.0*  MCV 98.9 97.2 98.1 96.2  --   PLT 323 271 321 398  --    Basic Metabolic Panel:  Recent Labs Lab 03/10/16 0148 03/11/16 0348 03/12/16 0316 03/13/16 1314 03/13/16 1319  NA 130* 127* 129* 123* 123*  K 3.5 4.1 4.4 4.4 4.4  CL 92* 94* 93* 86* 85*  CO2 28 27 28 28   --   GLUCOSE 179* 160* 146* 168* 169*  BUN 11 14 13 15 18   CREATININE 1.01* 0.89 0.91 0.85 0.80  CALCIUM 8.8* 8.3* 8.6* 8.5*  --    GFR: Estimated Creatinine Clearance: 42.5 mL/min (by C-G formula based on SCr of 0.8  mg/dL). Liver Function Tests:  Recent Labs Lab 03/10/16 0148 03/13/16 1314  AST 24 42*  ALT 10* 35  ALKPHOS 57 51  BILITOT 0.3 0.6  PROT 7.6 7.4  ALBUMIN 2.8* 2.9*   No results for input(s): LIPASE, AMYLASE in the last 168 hours. No results for input(s): AMMONIA in the last 168 hours. Coagulation Profile:  Recent Labs Lab 03/10/16 0438 03/13/16 1314  INR 1.07 1.05   Cardiac Enzymes:  Recent Labs Lab 03/10/16 0438 03/10/16 0934 03/10/16 1611  TROPONINI 0.41* 0.47* 0.30*   BNP (last 3 results) No results for input(s): PROBNP in the last 8760 hours. HbA1C: No results for input(s): HGBA1C in the last 72 hours. CBG: No results for input(s): GLUCAP in the last 168 hours. Lipid Profile: No results for input(s): CHOL, HDL, LDLCALC, TRIG, CHOLHDL, LDLDIRECT in the last 72 hours. Thyroid Function Tests: No results for input(s): TSH, T4TOTAL, FREET4, T3FREE, THYROIDAB in the last 72 hours. Anemia Panel: No results for input(s): VITAMINB12, FOLATE, FERRITIN, TIBC, IRON, RETICCTPCT in the last 72 hours. Urine analysis:    Component Value Date/Time   COLORURINE YELLOW 03/13/2016 1550    APPEARANCEUR CLEAR 03/13/2016 1550   LABSPEC 1.020 03/13/2016 1550   PHURINE 7.5 03/13/2016 1550   GLUCOSEU 100 (A) 03/13/2016 1550   HGBUR NEGATIVE 03/13/2016 1550   BILIRUBINUR NEGATIVE 03/13/2016 1550   KETONESUR NEGATIVE 03/13/2016 1550   PROTEINUR 30 (A) 03/13/2016 1550   NITRITE NEGATIVE 03/13/2016 1550   LEUKOCYTESUR NEGATIVE 03/13/2016 1550   Sepsis Labs: !!!!!!!!!!!!!!!!!!!!!!!!!!!!!!!!!!!!!!!!!!!! @LABRCNTIP (procalcitonin:4,lacticidven:4) ) Recent Results (from the past 240 hour(s))  Culture, blood (routine x 2) Call MD if unable to obtain prior to antibiotics being given     Status: None (Preliminary result)   Collection Time: 03/10/16  1:48 AM  Result Value Ref Range Status   Specimen Description BLOOD LEFT ARM  Final   Special Requests BOTTLES DRAWN AEROBIC AND ANAEROBIC 5CC EA  Final   Culture NO GROWTH 3 DAYS  Final   Report Status PENDING  Incomplete  Urine culture     Status: None   Collection Time: 03/10/16  3:10 AM  Result Value Ref Range Status   Specimen Description URINE, CLEAN CATCH  Final   Special Requests NONE  Final   Culture NO GROWTH  Final   Report Status 03/11/2016 FINAL  Final  Respiratory Panel by PCR     Status: None   Collection Time: 03/10/16  4:38 AM  Result Value Ref Range Status   Adenovirus NOT DETECTED NOT DETECTED Final   Coronavirus 229E NOT DETECTED NOT DETECTED Final   Coronavirus HKU1 NOT DETECTED NOT DETECTED Final   Coronavirus NL63 NOT DETECTED NOT DETECTED Final   Coronavirus OC43 NOT DETECTED NOT DETECTED Final   Metapneumovirus NOT DETECTED NOT DETECTED Final   Rhinovirus / Enterovirus NOT DETECTED NOT DETECTED Final   Influenza A NOT DETECTED NOT DETECTED Final   Influenza B NOT DETECTED NOT DETECTED Final   Parainfluenza Virus 1 NOT DETECTED NOT DETECTED Final   Parainfluenza Virus 2 NOT DETECTED NOT DETECTED Final   Parainfluenza Virus 3 NOT DETECTED NOT DETECTED Final   Parainfluenza Virus 4 NOT DETECTED NOT DETECTED  Final   Respiratory Syncytial Virus NOT DETECTED NOT DETECTED Final   Bordetella pertussis NOT DETECTED NOT DETECTED Final   Chlamydophila pneumoniae NOT DETECTED NOT DETECTED Final   Mycoplasma pneumoniae NOT DETECTED NOT DETECTED Final  Culture, blood (routine x 2) Call MD if unable to obtain prior to  antibiotics being given     Status: None (Preliminary result)   Collection Time: 03/10/16  4:38 AM  Result Value Ref Range Status   Specimen Description BLOOD RIGHT ANTECUBITAL  Final   Special Requests BOTTLES DRAWN AEROBIC AND ANAEROBIC 5CC EA  Final   Culture NO GROWTH 3 DAYS  Final   Report Status PENDING  Incomplete     Radiological Exams on Admission: Ct Angio Head W Or Wo Contrast  Result Date: 03/13/2016 CLINICAL DATA:  82 year old female with headache, blurred vision, dilated pupil. Hypertension. Confusion. Initial encounter. EXAM: CT ANGIOGRAPHY HEAD TECHNIQUE: Multidetector CT imaging of the head was performed using the standard protocol during bolus administration of intravenous contrast. Multiplanar CT image reconstructions and MIPs were obtained to evaluate the vascular anatomy. CONTRAST:  50 mL Isovue 370 COMPARISON:  Noncontrast head CT 03/13/2016 FINDINGS: Posterior circulation: Patent distal vertebral arteries, the left is dominant and primarily supplies the basilar. The right vertebral artery is non dominant and functionally terminates in PICA. Normal left PICA origin. Mild to moderate basilar tortuosity without stenosis. Normal SCA and PCA origins. Both posterior communicating arteries are present. P1 segments are within normal limits, however there is moderate bilateral P2 segment irregularity and stenosis, greater on the left (series 10, image 19). Distal PCA enhancement is preserved. Anterior circulation: Negative distal cervical ICAs. Both ICA siphons are patent. Tortuous siphons greater on the right. Siphon calcified atherosclerosis without stenosis. Ophthalmic and posterior  communicating artery origins are normal. Normal carotid termini. Dominant right and diminutive left ACA A1 segments. Anterior communicating artery and bilateral ACA branches are within normal limits. Left MCA origin, M1 segment, bifurcation, and left MCA branches are within normal limits. Right MCA origin, M1 segment, bifurcation, and right MCA branches are within normal limits. Venous sinuses: Patent. Anatomic variants: Dominant left vertebral artery. Dominant right ACA A1 segment. Delayed phase: Stable gray-white matter differentiation throughout the brain. No acute intracranial hemorrhage identified. No midline shift, mass effect, or evidence of intracranial mass lesion. No ventriculomegaly. No abnormal enhancement identified. Chronic left sphenoid sinusitis. No acute osseous abnormality identified. Visualized orbits and scalp soft tissues are within normal limits. IMPRESSION: 1. Negative for emergent large vessel occlusion. No intracranial aneurysm. 2. No anterior circulation atherosclerosis. Mild ICA siphon dolichoectasia. 3. Moderate irregularity and stenosis of the bilateral PCA P2 segments, worse on the left. Preserved distal flow. 4.  Stable CT appearance of the brain since 1328 hours today. Electronically Signed   By: Odessa Fleming M.D.   On: 03/13/2016 15:47   Ct Head Code Stroke Wo Contrast`  Result Date: 03/13/2016 CLINICAL DATA:  Code stroke. Abnormal pupils and blood pressure over 200. Some confusion. EXAM: CT HEAD WITHOUT CONTRAST TECHNIQUE: Contiguous axial images were obtained from the base of the skull through the vertex without intravenous contrast. COMPARISON:  None. FINDINGS: No evidence of large acute infarct, focal mass effect, or intracranial hemorrhage. Nonspecific lucencies in subcortical and periventricular white matter compatible with mild chronic microvascular ischemic changes. Mild parenchymal volume loss. No hyperdense vessel identified. Extensive calcific atherosclerosis of cavernous  internal carotid arteries. Left greater than right sphenoid sinus opacification with small fluid levels. Paranasal sinuses are otherwise normally aerated. Mastoid air cells are normally aerated. Orbits are unremarkable. No displaced calvarial fracture. ASPECTS [facility] Stroke Program Early CT Score) - Ganglionic level infarction (caudate, lentiform nuclei, internal capsule, insula, M1-M3 cortex): 7 - Supraganglionic infarction (M4-M6 cortex): 10 Total score (0-10 with 10 being normal): 10 IMPRESSION: 1. No acute intracranial abnormality  is identified. If symptoms persist or if clinically indicated MRI is more sensitive for acute stroke. 2. Mild chronic microvascular ischemic changes and parenchymal volume loss. 3. Paranasal sinus disease with nonspecific fluid levels which may represent acute sinusitis. 4. ASPECTS is 10 These results were called by telephone at the time of interpretation on 03/13/2016 at 1:44 pm to Dr. Roxy Manns, who verbally acknowledged these results. Electronically Signed   By: Mitzi Hansen M.D.   On: 03/13/2016 13:44    EKG: Independently reviewed.   Assessment/Plan Principal Problem:   Mydriasis Active Problems:   Hypertension   Hyponatremia   Acute encephalopathy   Eye pain   Diastolic dysfunction    1.  Left ocular mydriasis. Mrs Oliphant is an 82 year old female recently hospitalized for pneumonia and COPD exacerbation developing mydriasis in her left eye associated with pain that started at approximately 10 AM. She presented as a code stroke with head imaging negative for acute intracranial abnormality. Seen by neurology in the emergency department did not feel was reflected stroke. There is concern that this could be due to acute glaucoma. Emergency room provider discussed case with ophthalmology spoke with Dr Cathey Endow requested urgent consultation. Patient to be seen in the emergency room.  2.  Hyponatremia. Lab work showing a sodium of 123, down from 129 when she was  discharged yesterday. Hyponatremia felt to be related to SIADH. On physical examination she appears euvolemic, though having lower extremity pitting edema, I worry  about of volume overloaded state as well. She was recently started on prednisone which could lead to fluid retention. Would avoid giving IV fluids tonight. Plan to check urine sodium and urine osmolality, repeat labs in a.m. Fluid restrict.   3.  Hypertension. In the emergency room she had elevated blood pressures, systolic blood pressures of 200. Provide as needed IV hydralazine, continue metoprolol 25 mg by mouth daily.   4.  History of diastolic dysfunction. She had a recent transthoracic echocardiogram performed on 10/21/2015 that showed preserved ejection fraction of 60-65% with grade 2 diastolic dysfunction. On exam has lower extremity pitting edema. Recently started on steroids. Monitor volume status  5.  Chronic obstructive pulmonary disease. Recently admitted for COPD exacerbation, discharged on 03/12/2016 on steroids and empiric antibiotic treatment. Plan to continue Levaquin 500 mg by mouth daily. As mentioned above holding steroids due to fluid retention.  6.  History of urinary retention. Monitoring ins and outs, Will Place foley catheter  7.  Acute encephalopathy. Family members report and patient have a functional decline, becoming increasingly confused, could be related to hyponatremia. Steroids may be a contributing factor as well.   DVT prophylaxis: Lovenox Code Status: Partial code Family Communication: I spoke to her son and daughter-in-law Disposition Plan: Admit to stepdown unit Consults called: Emergency room provider spoke with Dr Cathey Endow of ophthalmology. Neurology also consulted Admission status: Inpatient   Jeralyn Bennett MD [facility] Pager [phone]  If 7PM-7AM, please contact night-coverage [email or web address] Password [facility]  03/13/2016, 6:15 PM

## 2016-03-13 NOTE — ED Provider Notes (Signed)
Pt given to me at shift change with CT angio head pending.  Briefly, pt was recently admitted 8/28 - 8/30 for COPD exacerbation, admitted prior pneumonia and sepsis with hyponatremia, she was discharged home on Levaquin, is on home O2. Less than at 10 PM she had left eye pain with redness and tearing, her daughter noticed extremely elevated BP, relative to her normal 120-130's.  This morning at 10 am the pt complained of blurred vision in her left eye (which is her good eye).   She is slightly confused and sleepy, hypertensive.  Workup in the ER is significant for worsened hyponatremia, she complains of HA, neck pain, left eye pain.  On exam she has dilated left pupil, with afferent pupillary defect. IOP measured by preceding provider were slightly elevated L>R (please see her documentation for complete PE)  Will consult Ophthalmology Will call [facility] for re-admission  Dr. Charlotte Sanes consulted for left eye pain, blurry vision, pupillary defect, will see in ED, ETA 30 min. Dr. Vanessa Barbara from [facility] to admit to step-down unit for hyponatremia, AMS, and hypertension.  Gentle fluids initiated, 1L NS Will not treat pain with patients declining mental status - per primary RN, pt initially presented A&O x 3, talkative.  Currently is slowed, sleepy.       Danelle Berry, PA-C 03/13/16 1744    Zadie Rhine, MD 03/14/16 [phone]

## 2016-03-13 NOTE — Progress Notes (Signed)
Sodium is down to 121.  Will do a 1-time 500 cc bolus to see if there is a component of hypovolemia that may be contributing.  Will recheck with AM labs at 0500.  Georgana Curio, M.D.

## 2016-03-13 NOTE — ED Notes (Signed)
Pt reports pain in left eye and blurred vision in that eye that began just before calling EMS. PA Sophia notified and in at patient bedside to assess.

## 2016-03-13 NOTE — Consult Note (Signed)
Requesting Physician: Dr. Fredderick Severance    Chief Complaint: Left eye pain, headache, elevated blood pressure, decreased vision  History obtained from:  Patient   and family  HPI:                                                                                                                                         Alexis Andrews is an 82 y.o. female who was recently at the hospital for diagnosis of pneumonia and sepsis. Patient was sent home yesterday. Per family her blood pressure was well-controlled at 110/90. At about 5:00 this morning, her daughter noted that her blood pressure was slowly creeping up. She states that the systolic blood pressure went from 130, to 150, upwards into the 190s. The patient had told her daughter that she was unable to find her glasses and was having difficulty with her vision. Daughter does note that she has cataracts, right eye worse than left.  Evaluating the patient was noted that her left pupil is approximately a 4 mm in her right pupil is 1 mm. Per daughter this is not normal in both eyes usually are equal. In speaking with the patient, she states that she has been having on and off blurred vision for approximately 1 month. In addition to the blurred vision she is noted eye pain behind her left eye. Currently she has no pain behind her left eye and no pain with movement. On arrival to the emergency department her blood pressure was 199/101. CT of brain was obtained and did not show any intracranial blood or acute infarct. Intraocular pressures have not been obtained while in the ED as of yet.  Date last known well: Unable to determine Time last known well: Unable to determine tPA Given: No: No clear last known normal  Past Medical History:  Diagnosis Date  . COPD (chronic obstructive pulmonary disease) (HCC)   . Depression    "treated months ago; quit taking RX" (03/11/2016)  . Heart murmur   . Hypertension    "on beta blocker for arrhythmia; not for high blood  pressure" (03/11/2016)  . On home oxygen therapy    "2L; 24/7 since 02/29/2016" (03/11/2016)  . Pneumonia    "now and many years ago" (03/11/2016)  . Rheumatoid arthritis(714.0)     Past Surgical History:  Procedure Laterality Date  . DILATION AND CURETTAGE OF UTERUS    . EXCISIONAL HEMORRHOIDECTOMY  1960  . HAMMER TOE SURGERY    . TONSILLECTOMY  1946    Family History  Problem Relation Age of Onset  . Lung cancer Brother    Social History:  reports that she has quit smoking. Her smoking use included Cigarettes. She has a 19.50 pack-year smoking history. She has never used smokeless tobacco. She reports that she drinks about 2.4 oz of alcohol per week . Her drug history is not on file.  Allergies:  Allergies  Allergen Reactions  . Betadine [Povidone Iodine] Rash  . Keflex [Cephalexin] Itching and Rash    Medications:                                                                                                                          Current Facility-Administered Medications  Medication Dose Route Frequency Provider Last Rate Last Dose  . iopamidol (ISOVUE-370) 76 % injection           . tetracaine (PONTOCAINE) 0.5 % ophthalmic solution 1 drop  1 drop Both Eyes Once Elson Areas, PA-C       Current Outpatient Prescriptions  Medication Sig Dispense Refill  . albuterol (PROVENTIL HFA;VENTOLIN HFA) 108 (90 Base) MCG/ACT inhaler Inhale 2 puffs into the lungs every 4 (four) hours as needed for wheezing or shortness of breath. 1 Inhaler 0  . albuterol (PROVENTIL) (2.5 MG/3ML) 0.083% nebulizer solution Take 3 mLs (2.5 mg total) by nebulization every 2 (two) hours as needed for wheezing or shortness of breath. 75 mL 0  . aspirin 81 MG tablet Take 81 mg by mouth daily.    Marland Kitchen atenolol (TENORMIN) 25 MG tablet Take 25 mg by mouth daily.    . bethanechol (URECHOLINE) 25 MG tablet Take 1 tablet (25 mg total) by mouth 3 (three) times daily. 21 tablet 0  . Golimumab (SIMPONI [place]) Inject into  the skin See admin instructions. Every 8 weeks    . guaiFENesin (MUCINEX) 600 MG 12 hr tablet Take 2 tablets (1,200 mg total) by mouth 2 (two) times daily. 10 tablet 0  . levofloxacin (LEVAQUIN) 500 MG tablet Take 1 tablet (500 mg total) by mouth daily. 3 tablet 0  . loratadine (CLARITIN) 10 MG tablet Take 10 mg by mouth daily.    . predniSONE (DELTASONE) 10 MG tablet Take 4 tablets (40 mg) daily for 2 days, then, Take 3 tablets (30 mg) daily for 2 days, then, Take 2 tablets (20 mg) daily for 2 days, then, Take 1 tablets (10 mg) daily for 1 days, then stop 19 tablet 0  . tiotropium (SPIRIVA) 18 MCG inhalation capsule Place 18 mcg into inhaler and inhale daily.       ROS:  History obtained from the patient  General ROS: negative for - chills, fatigue, fever, night sweats, weight gain or weight loss Psychological ROS: negative for - behavioral disorder, hallucinations, memory difficulties, mood swings or suicidal ideation Ophthalmic ROS: Positive for - blurry vision,  eye pain or loss of vision ENT ROS: negative for - epistaxis, nasal discharge, oral lesions, sore throat, tinnitus or vertigo Allergy and Immunology ROS: negative for - hives or itchy/watery eyes Hematological and Lymphatic ROS: negative for - bleeding problems, bruising or swollen lymph nodes Endocrine ROS: negative for - galactorrhea, hair pattern changes, polydipsia/polyuria or temperature intolerance Respiratory ROS: Positive for - shortness of breath or wheezing Cardiovascular ROS: Positive for -  dyspnea on exertion, edema  Gastrointestinal ROS: Positive for -  nausea/vomiting Genito-Urinary ROS: negative for - dysuria, hematuria, incontinence or urinary frequency/urgency Musculoskeletal ROS: negative for - joint swelling or muscular weakness Neurological ROS: as noted in HPI Dermatological  ROS: negative for rash and skin lesion changes  Neurologic Examination:                                                                                                      Blood pressure 200/91, pulse 69, temperature 98 F (36.7 C), resp. rate 23, SpO2 97 %.  HEENT-  Normocephalic, no lesions, without obvious abnormality.  Normal external eye and conjunctiva.  Normal TM's bilaterally.  Normal auditory canals and external ears. Normal external nose, mucus membranes and septum.  Normal pharynx. Cardiovascular- S1, S2 normal, pulses palpable throughout   Lungs- no tachypnea, retractions or cyanosis Abdomen- normal findings: bowel sounds normal Extremities- no edema Lymph-no adenopathy palpable Musculoskeletal-no joint tenderness, deformity or swelling Skin-warm and dry, no hyperpigmentation, vitiligo, or suspicious lesions  Neurological Examination Mental Status: Alert, oriented to hospital and able to give a fairly good history.  Speech fluent without evidence of aphasia.  Able to follow 3 step commands without difficulty. Cranial Nerves: II: Discs difficult to visualize bilaterally secondary to significant cataract. bilaterally; limited bilaterally in all 4 quadrants secondary to cataracts., pupils showed a 1 mm on the right and a 3 mm on the left, round, the right pupil was more reactive than the left. III,IV, VI: ptosis not present, extra-ocular motions intact bilaterally V,VII: smile symmetric with a decreased left nasolabial fold, facial light touch sensation normal bilaterally VIII: hearing normal bilaterally IX,X: uvula rises symmetrically XI: bilateral shoulder shrug XII: midline tongue extension Motor: Right : Upper extremity   5/5    Left:     Upper extremity   5/5  Lower extremity   5/5     Lower extremity   5/5 Tone and bulk:normal tone throughout; no atrophy noted Sensory: Pinprick and light touch intact throughout, bilaterally Deep Tendon Reflexes: 2+ and symmetric  throughout Plantars: Right: downgoing   Left: downgoing Cerebellar: normal finger-to-nose,and normal heel-to-shin test Gait: Not tested       Lab Results: Basic Metabolic Panel:  Recent Labs Lab 03/10/16 0148 03/11/16 0348 03/12/16 0316 03/13/16 1319  NA 130* 127* 129* 123*  K 3.5 4.1 4.4 4.4  CL 92*  94* 93* 85*  CO2 28 27 28   --   GLUCOSE 179* 160* 146* 169*  BUN 11 14 13 18   CREATININE 1.01* 0.89 0.91 0.80  CALCIUM 8.8* 8.3* 8.6*  --     Liver Function Tests:  Recent Labs Lab 03/10/16 0148  AST 24  ALT 10*  ALKPHOS 57  BILITOT 0.3  PROT 7.6  ALBUMIN 2.8*   No results for input(s): LIPASE, AMYLASE in the last 168 hours. No results for input(s): AMMONIA in the last 168 hours.  CBC:  Recent Labs Lab 03/10/16 0148 03/11/16 0348 03/12/16 0316 03/13/16 1314 03/13/16 1319  WBC 13.0* 13.9* 11.0* 14.0*  --   NEUTROABS 11.5*  --   --  12.5*  --   HGB 10.9* 8.6* 9.2* 10.4* 11.9*  HCT 35.3* 27.9* 30.4* 32.9* 35.0*  MCV 98.9 97.2 98.1 96.2  --   PLT 323 271 321 398  --     Cardiac Enzymes:  Recent Labs Lab 03/10/16 0438 03/10/16 0934 03/10/16 1611  TROPONINI 0.41* 0.47* 0.30*    Lipid Panel:  Recent Labs Lab 03/10/16 0438  CHOL 187  TRIG 86  HDL 52  CHOLHDL 3.6  VLDL 17  LDLCALC 161*    CBG: No results for input(s): GLUCAP in the last 168 hours.  Microbiology: Results for orders placed or performed during the hospital encounter of 03/10/16  Culture, blood (routine x 2) Call MD if unable to obtain prior to antibiotics being given     Status: None (Preliminary result)   Collection Time: 03/10/16  1:48 AM  Result Value Ref Range Status   Specimen Description BLOOD LEFT ARM  Final   Special Requests BOTTLES DRAWN AEROBIC AND ANAEROBIC 5CC EA  Final   Culture NO GROWTH 2 DAYS  Final   Report Status PENDING  Incomplete  Urine culture     Status: None   Collection Time: 03/10/16  3:10 AM  Result Value Ref Range Status   Specimen  Description URINE, CLEAN CATCH  Final   Special Requests NONE  Final   Culture NO GROWTH  Final   Report Status 03/11/2016 FINAL  Final  Respiratory Panel by PCR     Status: None   Collection Time: 03/10/16  4:38 AM  Result Value Ref Range Status   Adenovirus NOT DETECTED NOT DETECTED Final   Coronavirus 229E NOT DETECTED NOT DETECTED Final   Coronavirus HKU1 NOT DETECTED NOT DETECTED Final   Coronavirus NL63 NOT DETECTED NOT DETECTED Final   Coronavirus OC43 NOT DETECTED NOT DETECTED Final   Metapneumovirus NOT DETECTED NOT DETECTED Final   Rhinovirus / Enterovirus NOT DETECTED NOT DETECTED Final   Influenza A NOT DETECTED NOT DETECTED Final   Influenza B NOT DETECTED NOT DETECTED Final   Parainfluenza Virus 1 NOT DETECTED NOT DETECTED Final   Parainfluenza Virus 2 NOT DETECTED NOT DETECTED Final   Parainfluenza Virus 3 NOT DETECTED NOT DETECTED Final   Parainfluenza Virus 4 NOT DETECTED NOT DETECTED Final   Respiratory Syncytial Virus NOT DETECTED NOT DETECTED Final   Bordetella pertussis NOT DETECTED NOT DETECTED Final   Chlamydophila pneumoniae NOT DETECTED NOT DETECTED Final   Mycoplasma pneumoniae NOT DETECTED NOT DETECTED Final  Culture, blood (routine x 2) Call MD if unable to obtain prior to antibiotics being given     Status: None (Preliminary result)   Collection Time: 03/10/16  4:38 AM  Result Value Ref Range Status   Specimen Description BLOOD RIGHT ANTECUBITAL  Final  Special Requests BOTTLES DRAWN AEROBIC AND ANAEROBIC 5CC EA  Final   Culture NO GROWTH 2 DAYS  Final   Report Status PENDING  Incomplete    Coagulation Studies: No results for input(s): LABPROT, INR in the last 72 hours.  Imaging: Ct Head Code Stroke Wo Contrast`  Result Date: 03/13/2016 CLINICAL DATA:  Code stroke. Abnormal pupils and blood pressure over 200. Some confusion. EXAM: CT HEAD WITHOUT CONTRAST TECHNIQUE: Contiguous axial images were obtained from the base of the skull through the vertex  without intravenous contrast. COMPARISON:  None. FINDINGS: No evidence of large acute infarct, focal mass effect, or intracranial hemorrhage. Nonspecific lucencies in subcortical and periventricular white matter compatible with mild chronic microvascular ischemic changes. Mild parenchymal volume loss. No hyperdense vessel identified. Extensive calcific atherosclerosis of cavernous internal carotid arteries. Left greater than right sphenoid sinus opacification with small fluid levels. Paranasal sinuses are otherwise normally aerated. Mastoid air cells are normally aerated. Orbits are unremarkable. No displaced calvarial fracture. ASPECTS [facility] Stroke Program Early CT Score) - Ganglionic level infarction (caudate, lentiform nuclei, internal capsule, insula, M1-M3 cortex): 7 - Supraganglionic infarction (M4-M6 cortex): 10 Total score (0-10 with 10 being normal): 10 IMPRESSION: 1. No acute intracranial abnormality is identified. If symptoms persist or if clinically indicated MRI is more sensitive for acute stroke. 2. Mild chronic microvascular ischemic changes and parenchymal volume loss. 3. Paranasal sinus disease with nonspecific fluid levels which may represent acute sinusitis. 4. ASPECTS is 10 These results were called by telephone at the time of interpretation on 03/13/2016 at 1:44 pm to Dr. Roxy Manns, who verbally acknowledged these results. Electronically Signed   By: Mitzi Hansen M.D.   On: 03/13/2016 13:44       Assessment and plan discussed with with attending physician and they are in agreement.    Felicie Morn PA-C [facility] [phone]  03/13/2016, 1:48 PM   Assessment: 82 y.o. female presenting to the hospital with hypertensive urgency with associated symptoms of decreased vision, described new left dilated pupil, intermittent eye pain and headache. Physical exam is notable for a dilated sluggish pupil on the left eye along with significant bilateral cataracts. At this  time CT of head does not show any acute bleed or infarct. CVA is less likely at this time. Higher on the differential diagnosis is concerning for possible intracranial aneurysm versus acute angle glaucoma.  This has been discussed with the ED physician assistant,  And it was recommended to get an intraocular pressure.  Stroke Risk Factors - hypertension  Recommend: 1) CTA of her head 2) ED to obtain an intraocular pressure 3) ophthalmology consult    Neurology Attending Addendum  This patient was seen, examined, and d/w PA. I have reviewed the note and agree with the findings, assessment and plan as documented with the following additions.   This patient was seen in the emergency Department as a code stroke. I was present for the entirety of the evaluation and agree with documentation from the PA. There is inconsistent history regarding the patient's symptoms. The patient clearly states that she has had intermittent problems with the vision in the left eye for several weeks. However, her daughter insists that this is new today. Time of onset is not clear and has been reported as early as 6:00 AM and as late as 10:00 AM. She did develop headache last night. Daughter reports steadily increasing blood pressures over the course of last night and this morning.  On examination, the patient's elemental neurologic  examination is largely unremarkable. The left globe is firm. She has a large pupil on the left eye measuring about 5 mm. This is nonreactive to light. I have difficulty visualizing her fundi due to bilateral lens opacifications. Extraocular movements are intact. NIH stroke skull score was 0.  ED PA measured ocular pressures using Tonopen. 3 measurements were taken in each eye. Right eye measurements were 11, 14, 12. Left eye measurements were 21, 27, 24.  I have personally and independently reviewed the CT scan of the head without contrast from today. This shows mild diffuse generalized  atrophy. A mild burden of chronic small vessel ischemic change is noted in the bihemispheric white matter. Atherosclerotic calcifications of the intracranial vessels is apparent. No evidence of hemorrhage or other acute intracranial pathology.  CT angiogram of the head was obtained. This shows no evidence of an intracranial aneurysm. There are no intracranial large vessel occlusions. Both Elease Hashimoto will arteries are somewhat irregular and mildly stenotic.  Impression: 1. Left eye vision loss 2. Left mydriasis  Recommendations: As per PA note. Symptoms are not consistent with acute ischemic stroke. CT head and CT angiogram do not reveal any evidence of an intracranial aneurysm or other mass lesion that would explain unilateral mydriasis. She reports pain in the left eye, arguing against a central retinal artery occlusion or central retinal vein occlusion as these typically resultant painless vision loss. Overall, clinical picture is concerning for possible acute glaucoma. Recommend urgent ophthalmological evaluation. No additional recommendations at this time. Please free to call if there are any additional questions or concerns.

## 2016-03-13 NOTE — ED Notes (Signed)
PT to X-ray

## 2016-03-13 NOTE — ED Provider Notes (Signed)
[facility]-EMERGENCY DEPT Provider Note   CSN: [id number] Arrival date & time: 03/13/16  1233     History   Chief Complaint Chief Complaint  Patient presents with  . Hypertension    HPI Alexis Andrews is a 82 y.o. female.  HPI  Past Medical History:  Diagnosis Date  . COPD (chronic obstructive pulmonary disease) (HCC)   . Depression    "treated months ago; quit taking RX" (03/11/2016)  . Heart murmur   . Hypertension    "on beta blocker for arrhythmia; not for high blood pressure" (03/11/2016)  . On home oxygen therapy    "2L; 24/7 since 02/29/2016" (03/11/2016)  . Pneumonia    "now and many years ago" (03/11/2016)  . Rheumatoid arthritis(714.0)     Patient Active Problem List   Diagnosis Date Noted  . Mild aortic stenosis 03/11/2016  . Demand ischemia (HCC)   . Elevated troponin 03/10/2016  . Acute on chronic respiratory failure with hypoxia (HCC) 03/10/2016  . Sepsis (HCC) 03/10/2016  . Hyponatremia 03/10/2016  . RA (rheumatoid arthritis) (HCC) 03/10/2016  . Hypertension   . COPD exacerbation (HCC)   . Essential hypertension     Past Surgical History:  Procedure Laterality Date  . DILATION AND CURETTAGE OF UTERUS    . EXCISIONAL HEMORRHOIDECTOMY  1960  . HAMMER TOE SURGERY    . TONSILLECTOMY  1946    OB History    No data available       Home Medications    Prior to Admission medications   Medication Sig Start Date End Date Taking? Authorizing Provider  albuterol (PROVENTIL HFA;VENTOLIN HFA) 108 (90 Base) MCG/ACT inhaler Inhale 2 puffs into the lungs every 4 (four) hours as needed for wheezing or shortness of breath. 03/12/16  Yes Shanker Levora Dredge, MD  albuterol (PROVENTIL) (2.5 MG/3ML) 0.083% nebulizer solution Take 3 mLs (2.5 mg total) by nebulization every 2 (two) hours as needed for wheezing or shortness of breath. 03/12/16  Yes Maretta Bees, MD  aspirin 81 MG tablet Take 81 mg by mouth daily.   Yes Historical Provider, MD  atenolol (TENORMIN) 25 MG  tablet Take 25 mg by mouth daily.   Yes Historical Provider, MD  bethanechol (URECHOLINE) 25 MG tablet Take 1 tablet (25 mg total) by mouth 3 (three) times daily. 03/12/16  Yes Shanker Levora Dredge, MD  Golimumab (SIMPONI [place]) Inject into the skin See admin instructions. Every 8 weeks   Yes Historical Provider, MD  guaiFENesin (MUCINEX) 600 MG 12 hr tablet Take 2 tablets (1,200 mg total) by mouth 2 (two) times daily. 03/12/16  Yes Shanker Levora Dredge, MD  levofloxacin (LEVAQUIN) 500 MG tablet Take 1 tablet (500 mg total) by mouth daily. 03/12/16  Yes Shanker Levora Dredge, MD  loratadine (CLARITIN) 10 MG tablet Take 10 mg by mouth daily.   Yes Historical Provider, MD  predniSONE (DELTASONE) 10 MG tablet Take 4 tablets (40 mg) daily for 2 days, then, Take 3 tablets (30 mg) daily for 2 days, then, Take 2 tablets (20 mg) daily for 2 days, then, Take 1 tablets (10 mg) daily for 1 days, then stop 03/12/16  Yes Maretta Bees, MD  tiotropium (SPIRIVA) 18 MCG inhalation capsule Place 18 mcg into inhaler and inhale daily.   Yes Historical Provider, MD    Family History Family History  Problem Relation Age of Onset  . Lung cancer Brother     Social History Social History  Substance Use Topics  . Smoking status:  Former Smoker    Packs/day: 0.75    Years: 26.00    Types: Cigarettes  . Smokeless tobacco: Never Used     Comment: 'quit smoking when I was 60"  . Alcohol use 2.4 oz/week    4 Glasses of wine per week     Allergies   Betadine [povidone iodine] and Keflex [cephalexin]   Review of Systems Review of Systems   Physical Exam Updated Vital Signs BP 200/91 (BP Location: Right Arm)   Pulse 69   Temp 98 F (36.7 C)   Resp 23   SpO2 97%   Physical Exam   ED Treatments / Results  Labs (all labs ordered are listed, but only abnormal results are displayed) Labs Reviewed  COMPREHENSIVE METABOLIC PANEL - Abnormal; Notable for the following:       Result Value   Sodium 123 (*)     Chloride 86 (*)    Glucose, Bld 168 (*)    Calcium 8.5 (*)    Albumin 2.9 (*)    AST 42 (*)    GFR calc non Af Amer 60 (*)    All other components within normal limits  CBC WITH DIFFERENTIAL/PLATELET - Abnormal; Notable for the following:    WBC 14.0 (*)    RBC 3.42 (*)    Hemoglobin 10.4 (*)    HCT 32.9 (*)    Neutro Abs 12.5 (*)    All other components within normal limits  I-STAT CHEM 8, ED - Abnormal; Notable for the following:    Sodium 123 (*)    Chloride 85 (*)    Glucose, Bld 169 (*)    Calcium, Ion 1.07 (*)    Hemoglobin 11.9 (*)    HCT 35.0 (*)    All other components within normal limits  PROTIME-INR  APTT  ETHANOL  CBC  DIFFERENTIAL  URINE RAPID DRUG SCREEN, HOSP PERFORMED  URINALYSIS, ROUTINE W REFLEX MICROSCOPIC (NOT AT [facility])  I-STAT TROPOININ, ED    EKG  EKG Interpretation None       Radiology Ct Head Code Stroke Wo Contrast`  Result Date: 03/13/2016 CLINICAL DATA:  Code stroke. Abnormal pupils and blood pressure over 200. Some confusion. EXAM: CT HEAD WITHOUT CONTRAST TECHNIQUE: Contiguous axial images were obtained from the base of the skull through the vertex without intravenous contrast. COMPARISON:  None. FINDINGS: No evidence of large acute infarct, focal mass effect, or intracranial hemorrhage. Nonspecific lucencies in subcortical and periventricular white matter compatible with mild chronic microvascular ischemic changes. Mild parenchymal volume loss. No hyperdense vessel identified. Extensive calcific atherosclerosis of cavernous internal carotid arteries. Left greater than right sphenoid sinus opacification with small fluid levels. Paranasal sinuses are otherwise normally aerated. Mastoid air cells are normally aerated. Orbits are unremarkable. No displaced calvarial fracture. ASPECTS [facility] Stroke Program Early CT Score) - Ganglionic level infarction (caudate, lentiform nuclei, internal capsule, insula, M1-M3 cortex): 7 - Supraganglionic  infarction (M4-M6 cortex): 10 Total score (0-10 with 10 being normal): 10 IMPRESSION: 1. No acute intracranial abnormality is identified. If symptoms persist or if clinically indicated MRI is more sensitive for acute stroke. 2. Mild chronic microvascular ischemic changes and parenchymal volume loss. 3. Paranasal sinus disease with nonspecific fluid levels which may represent acute sinusitis. 4. ASPECTS is 10 These results were called by telephone at the time of interpretation on 03/13/2016 at 1:44 pm to Dr. Roxy Manns, who verbally acknowledged these results. Electronically Signed   By: Mitzi Hansen M.D.   On: 03/13/2016 13:44  Procedures Procedures (including critical care time)  Medications Ordered in ED Medications  iopamidol (ISOVUE-370) 76 % injection (50 mLs  Contrast Given 03/13/16 1315)  tetracaine (PONTOCAINE) 0.5 % ophthalmic solution 1 drop (1 drop Both Eyes Given 03/13/16 1354)     Initial Impression / Assessment and Plan / ED Course  I have reviewed the triage vital signs and the nursing notes.  Pertinent labs & imaging results that were available during my care of the patient were reviewed by me and considered in my medical decision making (see chart for details).  Clinical Course      Final Clinical Impressions(s) / ED Diagnoses   Final diagnoses:  Dilated pupil  HA (headache)  Blurred vision, bilateral    New Prescriptions New Prescriptions   No medications on file     Elson Areas, PA-C 03/13/16 1545    Vanetta Mulders, MD 03/13/16 [phone]

## 2016-03-13 NOTE — Progress Notes (Signed)
Patient with acute angle glaucoma as per Dr. Charlotte Sanes.  Unfortunately, there is no laser available here in the OR and so patient cannot have this taken care of as in inpatient.  Please see Dr. Marnee Spring note for full details but in short the plan is: -Morphine overnight for pain control -Zofran for n/v control -Medical management of IOP with Diamox (given in ER and will give an additional dose of 500 mg IV tomorrow at 0630); continue Brimonidine, Cosopt, and Pilocarpine. -BP elevation is likely related to pain and so may improve with above measures.  Continue with hydralazine prn -Recheck BMP stat now to try to ensure improvement in sodium (and will text result to Dr. Charlotte Sanes) -Prepare for first thing AM discharge to Dr. Marnee Spring office for possible laser vs. OR procedure tomorrow -Patient needs to be NPO after midnight -Flow manager notified to page rounding physician first thing in AM  Georgana Curio, M.D.

## 2016-03-13 NOTE — Code Documentation (Signed)
82yo female arriving to [facility] via [facility] at 1233.  Patient from home where she had left eye pain and headache at 2200.  The pain progressed as well as her BP increased overnight through this morning.  At approximately 0900 the patient asked her daughter for assistance locating her glasses and reported difficulty seeing out of the left eye.  Of note, the patient reports difficulty with her left eye x 2 weeks, however, her daughter refutes this stating that patient has numerous medical issues over the last two weeks for which she was hospitalized but no issues with her eye.  Patient is hypertensive in the ED.  Code stroke activated.  Patient to CT.  Stroke team to the bedside.  CT completed.  NIHSS 0, see documentation for details and code stroke times.  Patient with bilateral peripheral vision loss and unequal pupils on exam.  No acute stroke treatment at this time.  Code stroke canceled.  Bedside handoff with ED RN Pete Glatter.

## 2016-03-13 NOTE — ED Triage Notes (Signed)
Pt to ER BIB [facility] from home with complaint of left eye pain. BP 199/101. Per EMS left pupil 89mm, right pupil 110mm. A/o x4. Denies fall or injury. Not on blood thinners. Pt discharged from hospital yesterday after being treated for pneumonia.

## 2016-03-13 NOTE — Consult Note (Signed)
Reason for consult:  Eye pain OS  HPI: Alexis Andrews is an 82 y.o. female.  She had severe pain OS starting last night at 10 pm.  She came to ER this afternoon.  She had a CTA.  She had a neurology consult. She has lost severe vision.  She is in extreme eye pain OS.  OD feels OK.  She is nauseated.  She has a headache.  She was recently admitted to the hospital for pneumonia and sepsis.  She has just been readmitted for high blood pressure.  Past Medical History:  Diagnosis Date  . COPD (chronic obstructive pulmonary disease) ([place])   . Depression    "treated months ago; quit taking RX" (03/11/2016)  . Heart murmur   . Hypertension    "on beta blocker for arrhythmia; not for high blood pressure" (03/11/2016)  . On home oxygen therapy    "2L; 24/7 since 02/29/2016" (03/11/2016)  . Pneumonia    "now and many years ago" (03/11/2016)  . Rheumatoid arthritis(714.0)    Past Surgical History:  Procedure Laterality Date  . DILATION AND CURETTAGE OF UTERUS    . EXCISIONAL HEMORRHOIDECTOMY  1960  . HAMMER TOE SURGERY    . TONSILLECTOMY  1946   Family History  Problem Relation Age of Onset  . Lung cancer Brother    No current facility-administered medications for this encounter.    Current Outpatient Prescriptions  Medication Sig Dispense Refill  . albuterol (PROVENTIL HFA;VENTOLIN HFA) 108 (90 Base) MCG/ACT inhaler Inhale 2 puffs into the lungs every 4 (four) hours as needed for wheezing or shortness of breath. 1 Inhaler 0  . albuterol (PROVENTIL) (2.5 MG/3ML) 0.083% nebulizer solution Take 3 mLs (2.5 mg total) by nebulization every 2 (two) hours as needed for wheezing or shortness of breath. 75 mL 0  . aspirin 81 MG tablet Take 81 mg by mouth daily.    Marland Kitchen atenolol (TENORMIN) 25 MG tablet Take 25 mg by mouth daily.    . bethanechol (URECHOLINE) 25 MG tablet Take 1 tablet (25 mg total) by mouth 3 (three) times daily. 21 tablet 0  . Golimumab (SIMPONI [place]) Inject into the skin See admin  instructions. Every 8 weeks    . guaiFENesin (MUCINEX) 600 MG 12 hr tablet Take 2 tablets (1,200 mg total) by mouth 2 (two) times daily. 10 tablet 0  . levofloxacin (LEVAQUIN) 500 MG tablet Take 1 tablet (500 mg total) by mouth daily. 3 tablet 0  . loratadine (CLARITIN) 10 MG tablet Take 10 mg by mouth daily.    . predniSONE (DELTASONE) 10 MG tablet Take 4 tablets (40 mg) daily for 2 days, then, Take 3 tablets (30 mg) daily for 2 days, then, Take 2 tablets (20 mg) daily for 2 days, then, Take 1 tablets (10 mg) daily for 1 days, then stop 19 tablet 0  . tiotropium (SPIRIVA) 18 MCG inhalation capsule Place 18 mcg into inhaler and inhale daily.     Allergies  Allergen Reactions  . Betadine [Povidone Iodine] Rash  . Keflex [Cephalexin] Itching and Rash   Social History   Social History  . Marital status: Widowed    Spouse name: N/A  . Number of children: N/A  . Years of education: N/A   Occupational History  . Not on file.   Social History Main Topics  . Smoking status: Former Smoker    Packs/day: 0.75    Years: 26.00    Types: Cigarettes  . Smokeless tobacco: Never Used  Comment: 'quit smoking when I was 40"  . Alcohol use 2.4 oz/week    4 Glasses of wine per week  . Drug use: Unknown  . Sexual activity: Not on file   Other Topics Concern  . Not on file   Social History Narrative  . No narrative on file    Review of systems: ROS  Nausea, headache, fatigue, pneumonia,   Physical Exam:  Blood pressure 200/91, pulse 69, temperature 98 F (36.7 C), resp. rate 23, SpO2 97 %.   VA ([place]):  OD 20/100 OS  Hand motion  Pupils:   OD round, reactive to light,             OS round and larger than pupil OD, not reactive to light, +APD  IOP (T pen)  OD  27 OS  Too high to read by tonopen.  Eye is hard to palpation  CVF: Patient too nauseated  Motility:  OD full ductions  OS full ductions  Balance/alignment:  Ortho by Luiz Ochoa   Slit lamp examination:                                  OD                                       External/adnexa: Normal                                      Lids/lashes:        Ptosis                                      Conjunctiva        White, quiet        Cornea:              Clear                  AC:                     Shallow                              Iris:                     Normal        Lens:                  Dense NS                                     OS                                       External/adnexa: Normal  Lids/lashes:        Ptosis   (Lid height even OU)                                   Conjunctiva        1+ injection        Cornea:            Microcystic edema, steamy                  AC:                     Shallow.  Poor view through steamy cornea, but no obvious cell                                Iris:                     Mid-dilated pupil        Lens:                  Dense NS         Labs/studies: Results for orders placed or performed during the hospital encounter of 03/13/16 (from the past 48 hour(s))  Protime-INR     Status: None   Collection Time: 03/13/16  1:14 PM  Result Value Ref Range   Prothrombin Time 13.7 11.4 - 15.2 seconds   INR 1.05   APTT     Status: None   Collection Time: 03/13/16  1:14 PM  Result Value Ref Range   aPTT 34 24 - 36 seconds  Comprehensive metabolic panel     Status: Abnormal   Collection Time: 03/13/16  1:14 PM  Result Value Ref Range   Sodium 123 (L) 135 - 145 mmol/L   Potassium 4.4 3.5 - 5.1 mmol/L   Chloride 86 (L) 101 - 111 mmol/L   CO2 28 22 - 32 mmol/L   Glucose, Bld 168 (H) 65 - 99 mg/dL   BUN 15 6 - 20 mg/dL   Creatinine, Ser 0.85 0.44 - 1.00 mg/dL   Calcium 8.5 (L) 8.9 - 10.3 mg/dL   Total Protein 7.4 6.5 - 8.1 g/dL   Albumin 2.9 (L) 3.5 - 5.0 g/dL   AST 42 (H) 15 - 41 U/L   ALT 35 14 - 54 U/L   Alkaline Phosphatase 51 38 - 126 U/L   Total Bilirubin 0.6 0.3 - 1.2 mg/dL   GFR calc non Af Amer 60  (L) >60 mL/min   GFR calc Af Amer >60 >60 mL/min    Comment: (NOTE) The eGFR has been calculated using the CKD EPI equation. This calculation has not been validated in all clinical situations. eGFR's persistently <60 mL/min signify possible Chronic Kidney Disease.    Anion gap 9 5 - 15  CBC WITH DIFFERENTIAL     Status: Abnormal   Collection Time: 03/13/16  1:14 PM  Result Value Ref Range   WBC 14.0 (H) 4.0 - 10.5 K/uL   RBC 3.42 (L) 3.87 - 5.11 MIL/uL   Hemoglobin 10.4 (L) 12.0 - 15.0 g/dL   HCT 32.9 (L) 36.0 - 46.0 %   MCV 96.2 78.0 - 100.0 fL   MCH 30.4 26.0 - 34.0 pg   MCHC 31.6 30.0 - 36.0 g/dL   RDW 12.4 11.5 -  15.5 %   Platelets 398 150 - 400 K/uL   Neutrophils Relative % 89 %   Neutro Abs 12.5 (H) 1.7 - 7.7 K/uL   Lymphocytes Relative 9 %   Lymphs Abs 1.3 0.7 - 4.0 K/uL   Monocytes Relative 2 %   Monocytes Absolute 0.3 0.1 - 1.0 K/uL   Eosinophils Relative 0 %   Eosinophils Absolute 0.0 0.0 - 0.7 K/uL   Basophils Relative 0 %   Basophils Absolute 0.0 0.0 - 0.1 K/uL  I-stat troponin, ED (not at [facility])     Status: None   Collection Time: 03/13/16  1:17 PM  Result Value Ref Range   Troponin i, poc 0.04 0.00 - 0.08 ng/mL   Comment 3            Comment: Due to the release kinetics of cTnI, a negative result within the first hours of the onset of symptoms does not rule out myocardial infarction with certainty. If myocardial infarction is still suspected, repeat the test at appropriate intervals.   I-Stat Chem 8, ED  (not at [facility])     Status: Abnormal   Collection Time: 03/13/16  1:19 PM  Result Value Ref Range   Sodium 123 (L) 135 - 145 mmol/L   Potassium 4.4 3.5 - 5.1 mmol/L   Chloride 85 (L) 101 - 111 mmol/L   BUN 18 6 - 20 mg/dL   Creatinine, Ser 0.80 0.44 - 1.00 mg/dL   Glucose, Bld 169 (H) 65 - 99 mg/dL   Calcium, Ion 1.07 (L) 1.15 - 1.40 mmol/L   TCO2 30 0 - 100 mmol/L   Hemoglobin 11.9 (L) 12.0 - 15.0 g/dL   HCT 35.0 (L) 36.0 - 46.0 %  Urine  rapid drug screen (hosp performed)not at [facility]     Status: Abnormal   Collection Time: 03/13/16  3:50 PM  Result Value Ref Range   Opiates POSITIVE (A) NONE DETECTED   Cocaine NONE DETECTED NONE DETECTED   Benzodiazepines NONE DETECTED NONE DETECTED   Amphetamines NONE DETECTED NONE DETECTED   Tetrahydrocannabinol NONE DETECTED NONE DETECTED   Barbiturates NONE DETECTED NONE DETECTED    Comment:        DRUG SCREEN FOR MEDICAL PURPOSES ONLY.  IF CONFIRMATION IS NEEDED FOR ANY PURPOSE, NOTIFY LAB WITHIN 5 DAYS.        LOWEST DETECTABLE LIMITS FOR URINE DRUG SCREEN Drug Class       Cutoff (ng/mL) Amphetamine      1000 Barbiturate      200 Benzodiazepine   220 Tricyclics       254 Opiates          300 Cocaine          300 THC              50   Urinalysis, Routine w reflex microscopic (not at [facility])     Status: Abnormal   Collection Time: 03/13/16  3:50 PM  Result Value Ref Range   Color, Urine YELLOW YELLOW   APPearance CLEAR CLEAR   Specific Gravity, Urine 1.020 1.005 - 1.030   pH 7.5 5.0 - 8.0   Glucose, UA 100 (A) NEGATIVE mg/dL   Hgb urine dipstick NEGATIVE NEGATIVE   Bilirubin Urine NEGATIVE NEGATIVE   Ketones, ur NEGATIVE NEGATIVE mg/dL   Protein, ur 30 (A) NEGATIVE mg/dL   Nitrite NEGATIVE NEGATIVE   Leukocytes, UA NEGATIVE NEGATIVE  Urine microscopic-add on     Status: Abnormal  Collection Time: 03/13/16  3:50 PM  Result Value Ref Range   Squamous Epithelial / LPF 0-5 (A) NONE SEEN   WBC, UA 0-5 0 - 5 WBC/hpf   RBC / HPF NONE SEEN 0 - 5 RBC/hpf   Bacteria, UA RARE (A) NONE SEEN   Ct Angio Head W Or Wo Contrast  Result Date: 03/13/2016 CLINICAL DATA:  82 year old female with headache, blurred vision, dilated pupil. Hypertension. Confusion. Initial encounter. EXAM: CT ANGIOGRAPHY HEAD TECHNIQUE: Multidetector CT imaging of the head was performed using the standard protocol during bolus administration of intravenous contrast. Multiplanar CT image reconstructions and  MIPs were obtained to evaluate the vascular anatomy. CONTRAST:  50 mL Isovue 370 COMPARISON:  Noncontrast head CT 03/13/2016 FINDINGS: Posterior circulation: Patent distal vertebral arteries, the left is dominant and primarily supplies the basilar. The right vertebral artery is non dominant and functionally terminates in PICA. Normal left PICA origin. Mild to moderate basilar tortuosity without stenosis. Normal SCA and PCA origins. Both posterior communicating arteries are present. P1 segments are within normal limits, however there is moderate bilateral P2 segment irregularity and stenosis, greater on the left (series 10, image 19). Distal PCA enhancement is preserved. Anterior circulation: Negative distal cervical ICAs. Both ICA siphons are patent. Tortuous siphons greater on the right. Siphon calcified atherosclerosis without stenosis. Ophthalmic and posterior communicating artery origins are normal. Normal carotid termini. Dominant right and diminutive left ACA A1 segments. Anterior communicating artery and bilateral ACA branches are within normal limits. Left MCA origin, M1 segment, bifurcation, and left MCA branches are within normal limits. Right MCA origin, M1 segment, bifurcation, and right MCA branches are within normal limits. Venous sinuses: Patent. Anatomic variants: Dominant left vertebral artery. Dominant right ACA A1 segment. Delayed phase: Stable gray-white matter differentiation throughout the brain. No acute intracranial hemorrhage identified. No midline shift, mass effect, or evidence of intracranial mass lesion. No ventriculomegaly. No abnormal enhancement identified. Chronic left sphenoid sinusitis. No acute osseous abnormality identified. Visualized orbits and scalp soft tissues are within normal limits. IMPRESSION: 1. Negative for emergent large vessel occlusion. No intracranial aneurysm. 2. No anterior circulation atherosclerosis. Mild ICA siphon dolichoectasia. 3. Moderate irregularity and  stenosis of the bilateral PCA P2 segments, worse on the left. Preserved distal flow. 4.  Stable CT appearance of the brain since 1328 hours today. Electronically Signed   By: Genevie Ann M.D.   On: 03/13/2016 15:47   Dg Chest 2 View  Result Date: 03/13/2016 CLINICAL DATA:  Shortness of breath. Poor historian. History of hypertension, rheumatoid arthritis and COPD. EXAM: CHEST  2 VIEW COMPARISON:  Chest radiograph March 10, 2016 FINDINGS: The cardiac silhouette is mildly enlarged, mediastinal silhouette is nonsuspicious, calcified aortic knob. Diffuse interstitial prominence, similar to worse. Bibasilar strandy densities. No pleural effusion. No pneumothorax. Osteopenia. Soft tissue planes are nonsuspicious. Old RIGHT rib fractures. IMPRESSION: Stable cardiomegaly. Similar to increasing interstitial prominence suspicious for atypical infection without focal consolidation. Bibasilar atelectasis. Electronically Signed   By: Elon Alas M.D.   On: 03/13/2016 18:25   Ct Head Code Stroke Wo Contrast`  Result Date: 03/13/2016 CLINICAL DATA:  Code stroke. Abnormal pupils and blood pressure over 200. Some confusion. EXAM: CT HEAD WITHOUT CONTRAST TECHNIQUE: Contiguous axial images were obtained from the base of the skull through the vertex without intravenous contrast. COMPARISON:  None. FINDINGS: No evidence of large acute infarct, focal mass effect, or intracranial hemorrhage. Nonspecific lucencies in subcortical and periventricular white matter compatible with mild chronic microvascular ischemic changes.  Mild parenchymal volume loss. No hyperdense vessel identified. Extensive calcific atherosclerosis of cavernous internal carotid arteries. Left greater than right sphenoid sinus opacification with small fluid levels. Paranasal sinuses are otherwise normally aerated. Mastoid air cells are normally aerated. Orbits are unremarkable. No displaced calvarial fracture. ASPECTS [facility] Stroke Program Early CT Score) -  Ganglionic level infarction (caudate, lentiform nuclei, internal capsule, insula, M1-M3 cortex): 7 - Supraganglionic infarction (M4-M6 cortex): 10 Total score (0-10 with 10 being normal): 10 IMPRESSION: 1. No acute intracranial abnormality is identified. If symptoms persist or if clinically indicated MRI is more sensitive for acute stroke. 2. Mild chronic microvascular ischemic changes and parenchymal volume loss. 3. Paranasal sinus disease with nonspecific fluid levels which may represent acute sinusitis. 4. ASPECTS is 10 These results were called by telephone at the time of interpretation on 03/13/2016 at 1:44 pm to Dr. Shon Hale, who verbally acknowledged these results. Electronically Signed   By: Kristine Garbe M.D.   On: 03/13/2016 13:44                             Assessment and Plan:  Phacomorphic glaucoma.  Poor prognosis  I immediately gave Diamox 500 mg IV, Pilocarpine 2 % OS, Brimonidine OS, Cosopt OU.   YAG laser unable to be located by OR staff. Dellia Nims, assistant director of [facility] ER also looked inquired about the YAG laser, but it could not be located.    We are treating the glaucoma medically and she is feeling better.  Nausea improved.  Headache improved.  Poor prognosis discussed with daughter.  I discussed patient with the hospitalist who admitted her for low sodium and high BP.  Plan is to keep patient comfortable and control nausea/pain/IOP overnight.  Then discharge to my office 8:30 to arrange laser +/- emergent cataract extraction to definitively treat eye pressure.    Admitting team:  Please see below: Diamox 500 mg po sequel a 6:00 am Brimonidine 1 gtt OU tid Cosopt 1 gtt OU tid Pilocarpine 2% OS qid  Please discharge patient to my office in am if possible [facility]  [place] My cell:  [phone]  All of the above information was relayed to the patient and/or patient family.   I gave daughter my card.  Ophthalmic warning signs and  symptoms were reviewed, and clear instructions for immediate phone contact and/or immediate return to the ED or clinic were provided should any of these signs or symptoms occur.  Follow up contact information was provided.  All questions were answered.   Talmage L 03/13/2016, 6:38 PM  [facility] [phone]

## 2016-03-13 NOTE — ED Provider Notes (Signed)
[facility]-EMER[facility]ENCY DEPT Provider Note   CSN: [id number] Arrival date & time: 03/13/16  1233     History   Chief Complaint Chief Complaint  Patient presents with  . Hypertension    HPI Alexis Andrews is a 82 y.o. female.  The history is provided by the patient and a relative. No language interpreter was used.  Cerebrovascular Accident  This is a new problem. The current episode started 6 to 12 hours ago. The problem occurs constantly. The problem has not changed since onset.Pertinent negatives include no shortness of breath. Nothing aggravates the symptoms. Nothing relieves the symptoms. She has tried nothing for the symptoms.   Pt 's daughter reports she is concerned that pt has had a hemorrhagic stroke.  She reports pt's left pupil is enlarged.  She reports they were normal until 8*am this morning when pt reported that she is unable to see and daughter noticed dilated pupil.  No other areas of deficit. Daughter reports pt has been hypertensive since being in the hospital. She has been on lovenox.  Daughter is an Rn in ICU.  Past Medical History:  Diagnosis Date  . COPD (chronic obstructive pulmonary disease) (HCC)   . Depression    "treated months ago; quit taking RX" (03/11/2016)  . Heart murmur   . Hypertension    "on beta blocker for arrhythmia; not for high blood pressure" (03/11/2016)  . On home oxygen therapy    "2L; 24/7 since 02/29/2016" (03/11/2016)  . Pneumonia    "now and many years ago" (03/11/2016)  . Rheumatoid arthritis(714.0)     Patient Active Problem List   Diagnosis Date Noted  . Mild aortic stenosis 03/11/2016  . Demand ischemia (HCC)   . Elevated troponin 03/10/2016  . Acute on chronic respiratory failure with hypoxia (HCC) 03/10/2016  . Sepsis (HCC) 03/10/2016  . Hyponatremia 03/10/2016  . RA (rheumatoid arthritis) (HCC) 03/10/2016  . Hypertension   . COPD exacerbation (HCC)   . Essential hypertension     Past Surgical History:  Procedure Laterality  Date  . DILATION AND CURETTAGE OF UTERUS    . EXCISIONAL HEMORRHOIDECTOMY  1960  . HAMMER TOE SURGERY    . TONSILLECTOMY  1946    OB History    No data available       Home Medications    Prior to Admission medications   Medication Sig Start Date End Date Taking? Authorizing Provider  albuterol (PROVENTIL HFA;VENTOLIN HFA) 108 (90 Base) MCG/ACT inhaler Inhale 2 puffs into the lungs every 4 (four) hours as needed for wheezing or shortness of breath. 03/12/16   Shanker Levora Dredge, MD  albuterol (PROVENTIL) (2.5 MG/3ML) 0.083% nebulizer solution Take 3 mLs (2.5 mg total) by nebulization every 2 (two) hours as needed for wheezing or shortness of breath. 03/12/16   Maretta Bees, MD  aspirin 81 MG tablet Take 81 mg by mouth daily.    Historical Provider, MD  atenolol (TENORMIN) 25 MG tablet Take 25 mg by mouth daily.    Historical Provider, MD  bethanechol (URECHOLINE) 25 MG tablet Take 1 tablet (25 mg total) by mouth 3 (three) times daily. 03/12/16   Shanker Levora Dredge, MD  Golimumab (SIMPONI [place]) Inject into the skin See admin instructions. Every 8 weeks    Historical Provider, MD  guaiFENesin (MUCINEX) 600 MG 12 hr tablet Take 2 tablets (1,200 mg total) by mouth 2 (two) times daily. 03/12/16   Shanker Levora Dredge, MD  levofloxacin (LEVAQUIN) 500 MG tablet Take 1  tablet (500 mg total) by mouth daily. 03/12/16   Shanker Levora Dredge, MD  loratadine (CLARITIN) 10 MG tablet Take 10 mg by mouth daily.    Historical Provider, MD  predniSONE (DELTASONE) 10 MG tablet Take 4 tablets (40 mg) daily for 2 days, then, Take 3 tablets (30 mg) daily for 2 days, then, Take 2 tablets (20 mg) daily for 2 days, then, Take 1 tablets (10 mg) daily for 1 days, then stop 03/12/16   Maretta Bees, MD  tiotropium (SPIRIVA) 18 MCG inhalation capsule Place 18 mcg into inhaler and inhale daily.    Historical Provider, MD    Family History Family History  Problem Relation Age of Onset  . Lung cancer Brother      Social History Social History  Substance Use Topics  . Smoking status: Former Smoker    Packs/day: 0.75    Years: 26.00    Types: Cigarettes  . Smokeless tobacco: Never Used     Comment: 'quit smoking when I was 60"  . Alcohol use 2.4 oz/week    4 Glasses of wine per week     Allergies   Betadine [povidone iodine] and Keflex [cephalexin]   Review of Systems Review of Systems  Respiratory: Negative for shortness of breath.   All other systems reviewed and are negative.    Physical Exam Updated Vital Signs BP 200/91 (BP Location: Right Arm)   Pulse 69   Temp 98 F (36.7 C) (Oral)   Resp 23   SpO2 97%   Physical Exam  Constitutional: She appears well-developed and well-nourished. No distress.  HENT:  Head: Normocephalic and atraumatic.  Eyes: Conjunctivae and EOM are normal.  Large cataract left eye. Pupil enlarged.   Neck: Neck supple.  Cardiovascular: Normal rate and regular rhythm.   No murmur heard. Pulmonary/Chest: Effort normal and breath sounds normal. No respiratory distress.  Abdominal: Soft. There is no tenderness.  Musculoskeletal: She exhibits no edema.  Neurological: She is alert.  Skin: Skin is warm and dry.  Psychiatric: She has a normal mood and affect.  Nursing note and vitals reviewed.    ED Treatments / Results  Labs (all labs ordered are listed, but only abnormal results are displayed) Labs Reviewed  COMPREHENSIVE METABOLIC PANEL - Abnormal; Notable for the following:       Result Value   Sodium 123 (*)    Chloride 86 (*)    Glucose, Bld 168 (*)    Calcium 8.5 (*)    Albumin 2.9 (*)    AST 42 (*)    GFR calc non Af Amer 60 (*)    All other components within normal limits  CBC WITH DIFFERENTIAL/PLATELET - Abnormal; Notable for the following:    WBC 14.0 (*)    RBC 3.42 (*)    Hemoglobin 10.4 (*)    HCT 32.9 (*)    Neutro Abs 12.5 (*)    All other components within normal limits  I-STAT CHEM 8, ED - Abnormal; Notable for  the following:    Sodium 123 (*)    Chloride 85 (*)    Glucose, Bld 169 (*)    Calcium, Ion 1.07 (*)    Hemoglobin 11.9 (*)    HCT 35.0 (*)    All other components within normal limits  PROTIME-INR  APTT  ETHANOL  CBC  DIFFERENTIAL  URINE RAPID DRUG SCREEN, HOSP PERFORMED  URINALYSIS, ROUTINE W REFLEX MICROSCOPIC (NOT AT [facility])  Rosezena Sensor, ED  EKG  EKG Interpretation None       Radiology Ct Head Code Stroke Wo Contrast`  Result Date: 03/13/2016 CLINICAL DATA:  Code stroke. Abnormal pupils and blood pressure over 200. Some confusion. EXAM: CT HEAD WITHOUT CONTRAST TECHNIQUE: Contiguous axial images were obtained from the base of the skull through the vertex without intravenous contrast. COMPARISON:  None. FINDINGS: No evidence of large acute infarct, focal mass effect, or intracranial hemorrhage. Nonspecific lucencies in subcortical and periventricular white matter compatible with mild chronic microvascular ischemic changes. Mild parenchymal volume loss. No hyperdense vessel identified. Extensive calcific atherosclerosis of cavernous internal carotid arteries. Left greater than right sphenoid sinus opacification with small fluid levels. Paranasal sinuses are otherwise normally aerated. Mastoid air cells are normally aerated. Orbits are unremarkable. No displaced calvarial fracture. ASPECTS [facility] Stroke Program Early CT Score) - Ganglionic level infarction (caudate, lentiform nuclei, internal capsule, insula, M1-M3 cortex): 7 - Supraganglionic infarction (M4-M6 cortex): 10 Total score (0-10 with 10 being normal): 10 IMPRESSION: 1. No acute intracranial abnormality is identified. If symptoms persist or if clinically indicated MRI is more sensitive for acute stroke. 2. Mild chronic microvascular ischemic changes and parenchymal volume loss. 3. Paranasal sinus disease with nonspecific fluid levels which may represent acute sinusitis. 4. ASPECTS is 10 These results were called by  telephone at the time of interpretation on 03/13/2016 at 1:44 pm to Dr. Roxy Manns, who verbally acknowledged these results. Electronically Signed   By: Mitzi Hansen M.D.   On: 03/13/2016 13:44    Procedures Procedures (including critical care time)  Medications Ordered in ED Medications  iopamidol (ISOVUE-370) 76 % injection (not administered)     Initial Impression / Assessment and Plan / ED Course  I have reviewed the triage vital signs and the nursing notes.  Pertinent labs & imaging results that were available during my care of the patient were reviewed by me and considered in my medical decision making (see chart for details).  Clinical Course    Dr. Deretha Emory in to see    Final Clinical Impressions(s) / ED Diagnoses   Final diagnoses:  Dilated pupil    New Prescriptions New Prescriptions   No medications on file  Dr. Roxy Manns and PA. Smith in to see.  They do not feel pt has an acute CVA.  They requested ct angio and occular pressures  Right eye tonopen pressure 11,14, 12 on three measures Left eye 21,27, 24.  (Eye feels firmer and stiffer when I evaluate)  Elson Areas, PA-C 03/13/16 [address] [place], [place] 03/13/16 1545    Vanetta Mulders, MD 03/14/16 [phone]

## 2016-03-14 LAB — BASIC METABOLIC PANEL
ANION GAP: 11 (ref 5–15)
Anion gap: 9 (ref 5–15)
BUN: 12 mg/dL (ref 6–20)
BUN: 12 mg/dL (ref 6–20)
CALCIUM: 7.9 mg/dL — AB (ref 8.9–10.3)
CALCIUM: 8 mg/dL — AB (ref 8.9–10.3)
CO2: 23 mmol/L (ref 22–32)
CO2: 22 mmol/L (ref 22–32)
CREATININE: 0.69 mg/dL (ref 0.44–1.00)
Chloride: 90 mmol/L — ABNORMAL LOW (ref 101–111)
Chloride: 89 mmol/L — ABNORMAL LOW (ref 101–111)
Creatinine, Ser: 0.8 mg/dL (ref 0.44–1.00)
GFR calc Af Amer: >60 mL/min (ref >60)
GLUCOSE: 112 mg/dL — AB (ref 65–99)
Glucose, Bld: 103 mg/dL — ABNORMAL HIGH (ref 65–99)
Potassium: 3.4 mmol/L — ABNORMAL LOW (ref 3.5–5.1)
Potassium: 3.7 mmol/L (ref 3.5–5.1)
SODIUM: 122 mmol/L — AB (ref 135–145)
Sodium: 122 mmol/L — ABNORMAL LOW (ref 135–145)

## 2016-03-14 LAB — CBC
HEMATOCRIT: 28.9 % — AB (ref 36.0–46.0)
Hemoglobin: 9.1 g/dL — ABNORMAL LOW (ref 12.0–15.0)
MCH: 30.1 pg (ref 26.0–34.0)
MCHC: 31.5 g/dL (ref 30.0–36.0)
MCV: 95.7 fL (ref 78.0–100.0)
PLATELETS: 355 10*3/uL (ref 150–400)
RBC: 3.02 MIL/uL — ABNORMAL LOW (ref 3.87–5.11)
RDW: 12.6 % (ref 11.5–15.5)
WBC: 13.8 10*3/uL — AB (ref 4.0–10.5)

## 2016-03-14 LAB — SODIUM, URINE, RANDOM: SODIUM UR: 142 mmol/L

## 2016-03-14 LAB — MAGNESIUM: Magnesium: 1.8 mg/dL (ref 1.7–2.4)

## 2016-03-14 LAB — OSMOLALITY, URINE: OSMOLALITY UR: 447 mosm/kg (ref 300–900)

## 2016-03-14 LAB — MRSA PCR SCREENING: MRSA by PCR: NEGATIVE

## 2016-03-14 MED ORDER — FUROSEMIDE 10 MG/ML IJ SOLN
80.0000 mg | Freq: Once | INTRAMUSCULAR | Status: AC
Start: 2016-03-14 — End: 2016-03-14
  Administered 2016-03-14: 80 mg via INTRAVENOUS
  Filled 2016-03-14: qty 8

## 2016-03-14 MED ORDER — PREDNISOLONE ACETATE 1 % OP SUSP
1.0000 [drp] | Freq: Four times a day (QID) | OPHTHALMIC | Status: DC
  Administered 2016-03-14 – 2016-03-19 (×20): 1 [drp] via OPHTHALMIC
  Filled 2016-03-14: qty 1

## 2016-03-14 MED ORDER — MORPHINE SULFATE (PF) 2 MG/ML IV SOLN: 1.0000 mg | INTRAVENOUS | Status: DC | PRN

## 2016-03-14 MED ORDER — GUAIFENESIN ER 600 MG PO TB12
1200.0000 mg | ORAL_TABLET | Freq: Two times a day (BID) | ORAL | Status: DC | PRN
  Administered 2016-03-14 – 2016-03-16 (×4): 1200 mg via ORAL
  Filled 2016-03-14 (×3): qty 2

## 2016-03-14 MED ORDER — POTASSIUM CHLORIDE CRYS ER 20 MEQ PO TBCR
40.0000 meq | EXTENDED_RELEASE_TABLET | Freq: Once | ORAL | Status: AC
  Administered 2016-03-14: 40 meq via ORAL
  Filled 2016-03-14: qty 2

## 2016-03-14 MED ORDER — FUROSEMIDE 10 MG/ML IJ SOLN
80.0000 mg | Freq: Once | INTRAMUSCULAR | Status: AC
  Administered 2016-03-14: 80 mg via INTRAVENOUS
  Filled 2016-03-14: qty 8

## 2016-03-14 NOTE — Progress Notes (Signed)
Daughter of patient is requesting to speak with Doctor asap about the next steps in her mother's plan of care.

## 2016-03-14 NOTE — Progress Notes (Addendum)
[facility] TEAM 1 - Stepdown/ICU Alexis Andrews  MBW:[id number] DOB: Jun 04, 1927 DOA: 03/13/2016 PCP: Ginette Otto, MD    Brief Narrative:  82 y.o. F with history of COPD, HTN, RA, and a hospitalization 03/10/2016-03/12/2016 when she was treated for a COPD exacerbation and community-acquired pneumonia. During that hospitalization she was found to be hyponatremic with a sodium of 129 felt to be secondary to SIADH. She was also found to have urinary retention and given Bethachol on day of discharge by Urology. Family members noted her blood pressures were gradually increasing over 24 hours.  Alexis pt then reported acute pain and blurry vision in her left eye, with family noting Alexis left pupil was significantly larger compared to her right. She presented as a code stroke.   In Alexis ED a CT brain did not reveal an acute abnormality. CT angiogram did not show large vessel occlusion or intracranial aneurysm. She was evaluated by Neurology who did not feel this represented acute CVA. Lab work revealed a sodium of 123, decreased from 129 (03/12/2016). She was also hypertensive having systolic blood pressure of 200.  Subjective: Alexis Andrews is confused though alert.  She cannot provide a reliable review of systems.  She does however specifically deny any pain in her left eye.  She does not appear to be in significant pain otherwise and there is no evidence of respiratory distress.  Alexis Andrews's daughters is at Alexis bedside.  We have had an extensive discussion.  Alexis Andrews's daughter tells me right off that she is not interested in Alexis Andrews having emergency eye surgery today.  She reports Alexis Andrews's mental status has been on a steady decline for Alexis past 5-7 days.  She feels that this is related to Alexis Andrews's falling sodium levels and tells me "if we can't fix that I don't see Alexis point of going through eye surgery."  Alexis Andrews's daughter informs me that she is concerned that her mother is not  going to recover to her previous functional status and that if that is Alexis case she would wish to pursue comfort directed care only.  I have had a very frank discussion with her concerning Alexis likelihood that Alexis Andrews wouldn't entirely lose vision in her left eye and continue to experience significant pain if Alexis acute glaucoma is not addressed surgically.  I have explained Alexis basic anatomy of Alexis eye and Alexis pathophysiology of phacomorphic glaucoma and that removal of Alexis lens would be curative.  Nonetheless Alexis Andrews's daughter does not wish to pursue ophthalmologic surgery at this time and wishes instead to focus on her general medical condition.  She states she may be willing to reconsider eye surgery if Alexis Andrews begins to improve.  Again I have explained to her that delaying surgery could very well lead to complete blindness in Alexis left eye as well as ongoing severe pain and Alexis Andrews's daughter states that she feels Alexis Andrews is probably already blind in that eye regardless.  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 discussed care of this Andrews with Opthalmology at length - Ophthalmology continues to recommend emergent surgery but it is acknowledged that Alexis Andrews's next of kin is refusing surgery at this time - next of kin has been clearly educated on Alexis risk of avoiding surgery - medical tx will persist   Hyponatremia Admit Na 123, down from 129 Alexis day prior - previously urine osmol 220, w/ serum osmol 275 which is NOT c/w SIADH,  though earlier urine osmol was 368 and therefore more c/w SIADH  - TSH normal - Alexis Andrews has received high-dose Diamox and therefore repeat urine osmol and serum osmol at present would not likely be helpful - Alexis Andrews does appear modestly volume overloaded - dose with Lasix and follow sodium closely - though no lab reports are available Alexis Andrews's daughter confirms to me that her sodium "has always been normal" and that it has been  "checked many times before"  Recent Labs Lab 03/11/16 0348 03/12/16 0316 03/13/16 1314 03/13/16 1319 03/13/16 2147 03/14/16 0332  NA 127* 129* 123* 123* 121* 122*    Urinary retention foley catheter placed at admit - previous plan was for 5 day trial of bethanecol w/ I/O cath BID and monitoring - daughter reports no response to bethanechol prior to admission with 700-800cc of urine with I/O cath at home on multiple occasions - keep foley for now and follow w/ correction of Na  Acute encephalopathy Likely multifactorial to include recent steroids, urinary retention, hyponatremia, and sundowning - address each individual issue and follow  Hypokalemia Due to diuretic - replace and follow - check magnesium  Hypertension BP new much better controlled - likely exacerbated by severe eye pain   History of diastolic dysfunction TTE 10/21/2015 w/ EF 60-65% with grade 2DD - lower extremity pitting edema on admit - baseline wgt appears to be ~62kg  [employer]   03/13/16 2015  Weight: 65.3 kg (143 lb 15.4 oz)    COPD w/ recent exacerbation  Well compensated at present  Chronic anemia  Likely related to chronic inflammation in setting of RA  Recent Labs Lab 03/10/16 0148 03/11/16 0348 03/12/16 0316 03/13/16 1314 03/13/16 1319 03/14/16 0332  HGB 10.9* 8.6* 9.2* 10.4* 11.9* 9.1*    RA  DVT prophylaxis: lovenox  Code Status: Alexis Andrews's daughter/next of kin informs me she wishes for her mother to be NO CODE BLUE/DNR though she does wish to continue aggressive medical care short of this Family Communication: no family present at time of exam  Disposition Plan:   Consultants:  Neurology  Opthalmology   Procedures: None  Antimicrobials:  None   Objective: Blood pressure 127/63, pulse 63, temperature 97.6 F (36.4 C), temperature source Oral, resp. rate 20, height 5' (1.524 m), weight 65.3 kg (143 lb 15.4 oz), SpO2 98 %.  Intake/Output Summary (Last 24 hours)  at 03/14/16 0750 Last data filed at 03/14/16 0705  Gross per 24 hour  Intake              240 ml  Output             2325 ml  Net            -2085 ml   Filed Weights   03/13/16 2015  Weight: 65.3 kg (143 lb 15.4 oz)    Examination: General: No acute respiratory distress Lungs: Mild bibasilar crackles - no wheezing Cardiovascular: Regular rate and rhythm with 2/6 holosystolic murmur Abdomen: Nontender, nondistended, soft, bowel sounds positive, no rebound, no ascites, no appreciable mass Extremities: No significant cyanosis, or clubbing - 1+ edema bilateral lower extremities  CBC:  Recent Labs Lab 03/10/16 0148 03/11/16 0348 03/12/16 0316 03/13/16 1314 03/13/16 1319 03/14/16 0332  WBC 13.0* 13.9* 11.0* 14.0*  --  13.8*  NEUTROABS 11.5*  --   --  12.5*  --   --   HGB 10.9* 8.6* 9.2* 10.4* 11.9* 9.1*  HCT 35.3* 27.9* 30.4* 32.9* 35.0*  28.9*  MCV 98.9 97.2 98.1 96.2  --  95.7  PLT 323 271 321 398  --  355   Basic Metabolic Panel:  Recent Labs Lab 03/11/16 0348 03/12/16 0316 03/13/16 1314 03/13/16 1319 03/13/16 2147 03/14/16 0332  NA 127* 129* 123* 123* 121* 122*  K 4.1 4.4 4.4 4.4 3.5 3.4*  CL 94* 93* 86* 85* 89* 90*  CO2 27 28 28   --  24 23  GLUCOSE 160* 146* 168* 169* 143* 112*  BUN 14 13 15 18 13 12   CREATININE 0.89 0.91 0.85 0.80 0.73 0.69  CALCIUM 8.3* 8.6* 8.5*  --  8.1* 7.9*   GFR: Estimated Creatinine Clearance: 41.8 mL/min (by C-G formula based on SCr of 0.8 mg/dL).  Liver Function Tests:  Recent Labs Lab 03/10/16 0148 03/13/16 1314  AST 24 42*  ALT 10* 35  ALKPHOS 57 51  BILITOT 0.3 0.6  PROT 7.6 7.4  ALBUMIN 2.8* 2.9*    Coagulation Profile:  Recent Labs Lab 03/10/16 0438 03/13/16 1314  INR 1.07 1.05    Cardiac Enzymes:  Recent Labs Lab 03/10/16 0438 03/10/16 0934 03/10/16 1611  TROPONINI 0.41* 0.47* 0.30*    HbA1C: Hgb A1c MFr Bld  Date/Time Value Ref Range Status  03/10/2016 04:38 AM 6.2 (H) 4.8 - 5.6 % Final     Comment:    (NOTE)         Pre-diabetes: 5.7 - 6.4         Diabetes: >6.4         Glycemic control for adults with diabetes: <7.0      Recent Results (from Alexis past 240 hour(s))  Culture, blood (routine x 2) Call MD if unable to obtain prior to antibiotics being given     Status: None (Preliminary result)   Collection Time: 03/10/16  1:48 AM  Result Value Ref Range Status   Specimen Description BLOOD LEFT ARM  Final   Special Requests BOTTLES DRAWN AEROBIC AND ANAEROBIC 5CC EA  Final   Culture NO GROWTH 3 DAYS  Final   Report Status PENDING  Incomplete  Urine culture     Status: None   Collection Time: 03/10/16  3:10 AM  Result Value Ref Range Status   Specimen Description URINE, CLEAN CATCH  Final   Special Requests NONE  Final   Culture NO GROWTH  Final   Report Status 03/11/2016 FINAL  Final  Respiratory Panel by PCR     Status: None   Collection Time: 03/10/16  4:38 AM  Result Value Ref Range Status   Adenovirus NOT DETECTED NOT DETECTED Final   Coronavirus 229E NOT DETECTED NOT DETECTED Final   Coronavirus HKU1 NOT DETECTED NOT DETECTED Final   Coronavirus NL63 NOT DETECTED NOT DETECTED Final   Coronavirus OC43 NOT DETECTED NOT DETECTED Final   Metapneumovirus NOT DETECTED NOT DETECTED Final   Rhinovirus / Enterovirus NOT DETECTED NOT DETECTED Final   Influenza A NOT DETECTED NOT DETECTED Final   Influenza B NOT DETECTED NOT DETECTED Final   Parainfluenza Virus 1 NOT DETECTED NOT DETECTED Final   Parainfluenza Virus 2 NOT DETECTED NOT DETECTED Final   Parainfluenza Virus 3 NOT DETECTED NOT DETECTED Final   Parainfluenza Virus 4 NOT DETECTED NOT DETECTED Final   Respiratory Syncytial Virus NOT DETECTED NOT DETECTED Final   Bordetella pertussis NOT DETECTED NOT DETECTED Final   Chlamydophila pneumoniae NOT DETECTED NOT DETECTED Final   Mycoplasma pneumoniae NOT DETECTED NOT DETECTED Final  Culture, blood (routine  x 2) Call MD if unable to obtain prior to antibiotics  being given     Status: None (Preliminary result)   Collection Time: 03/10/16  4:38 AM  Result Value Ref Range Status   Specimen Description BLOOD RIGHT ANTECUBITAL  Final   Special Requests BOTTLES DRAWN AEROBIC AND ANAEROBIC 5CC EA  Final   Culture NO GROWTH 3 DAYS  Final   Report Status PENDING  Incomplete  MRSA PCR Screening     Status: None   Collection Time: 03/13/16 10:52 PM  Result Value Ref Range Status   MRSA by PCR NEGATIVE NEGATIVE Final    Comment:        Alexis GeneXpert MRSA Assay (FDA approved for NASAL specimens only), is one component of a comprehensive MRSA colonization surveillance program. It is not intended to diagnose MRSA infection nor to guide or monitor treatment for MRSA infections.      Scheduled Meds: . atenolol  25 mg Oral Daily  . brimonidine  1 drop Both Eyes TID  . dorzolamide-timolol  1 drop Both Eyes TID  . enoxaparin (LOVENOX) injection  40 mg Subcutaneous Q24H  . guaiFENesin  1,200 mg Oral BID  . levofloxacin  500 mg Oral Q breakfast  . pilocarpine  1 drop Left Eye QID  . sodium chloride flush  3 mL Intravenous Q12H  . sodium chloride flush  3 mL Intravenous Q12H     LOS: 1 day   Lonia Blood, MD [facility] Office  [phone] Pager - Text Page per Loretha Stapler as per below:  On-Call/Text Page:      [email or web address]      password [email or web address]  If 7PM-7AM, please contact night-coverage [email or web address] Password [email or web address] 03/14/2016, 7:50 AM

## 2016-03-14 NOTE — Progress Notes (Addendum)
Ophthalmology Progress note.  F/U phacomorphic glaucoma  Subjective:  Patient reports eye pain has improved but still "sore".  No longer nauseated.    Objective:       Vision has improved OS to CF at 2 feet.  IOP 15 OD, 19 OS by tonopen.       Orthophoric.  EOM full         Conjuctiva OS is much less injected today.  Pupil OS is smaller than last night.  Still poorly reactive.  Dense NS OU.  Shallow AC OU  Assessment and Plan:  Phacomorphic glaucoma:  Medical management is currently successful in controlling IOP.  Patient needs YAG laser peripheral iridotomy and possible cataract extraction as out patient to prevent future glaucoma crisis.  I spoke with primary team.  Daughter would like to avoid surgery if at all possible.  For now:  Continue current eye drops while inpatient and continue as out patient.  Discontinue Diamox.  Call my office when disposition sorted out in order to schedule follow up.    Call me with questions.   Thank you  Maris Berger  [facility]  [facility] office:  [phone]    Above information discussed with Dr. Virgina Evener.  Also, will add steroid drop to decrease inflammation:  Prednisolone Acetate 1% 1 drop OS qid x one week, then bid x one week, then D/C  Thank you, Dr. Sharon Seller, for your help ordering drops!

## 2016-03-14 NOTE — Care Management Note (Signed)
Case Management Note  Patient Details  Name: Alexis Andrews MRN: [id number] Date of Birth: 1927/04/19  Subjective/Objective:      Patient was set up with [name] by previous NCM for [facility] and [facility].  Patient lives with her daughter, [name].   NCM received call from rep with [name] stating that patient is with them and she is following along.  She has home oxygen with [facility]. NCM will cont to follow for dc needs.               Action/Plan:   Expected Discharge Date:  03/15/16               Expected Discharge Plan:  Home w Home Health Services  In-House Referral:     Discharge planning Services  CM Consult  Post Acute Care Choice:  Resumption of Svcs/PTA Provider Choice offered to:     DME Arranged:    DME Agency:     HH Arranged:  RN, PT HH Agency:  [facility] (now [facility])  Status of Service:  In process, will continue to follow  If discussed at Long Length of Stay Meetings, dates discussed:    Additional Comments:  Leone Haven, RN 03/14/2016, 5:06 PM

## 2016-03-14 NOTE — Plan of Care (Signed)
Can patient please have Spiriva ordered as at home. Daughter is concerned.

## 2016-03-15 LAB — CBC
HCT: 31 % — ABNORMAL LOW (ref 36.0–46.0)
HEMOGLOBIN: 9.7 g/dL — AB (ref 12.0–15.0)
MCH: 29.9 pg (ref 26.0–34.0)
MCHC: 31.3 g/dL (ref 30.0–36.0)
MCV: 95.7 fL (ref 78.0–100.0)
PLATELETS: 297 10*3/uL (ref 150–400)
RBC: 3.24 MIL/uL — AB (ref 3.87–5.11)
RDW: 12.4 % (ref 11.5–15.5)
WBC: 10.4 10*3/uL (ref 4.0–10.5)

## 2016-03-15 LAB — COMPREHENSIVE METABOLIC PANEL
ALK PHOS: 39 U/L (ref 38–126)
ALT: 19 U/L (ref 14–54)
ANION GAP: 8 (ref 5–15)
AST: 15 U/L (ref 15–41)
Albumin: 2.3 g/dL — ABNORMAL LOW (ref 3.5–5.0)
BUN: 16 mg/dL (ref 6–20)
CALCIUM: 8.4 mg/dL — AB (ref 8.9–10.3)
CHLORIDE: 87 mmol/L — AB (ref 101–111)
CO2: 30 mmol/L (ref 22–32)
CREATININE: 1.12 mg/dL — AB (ref 0.44–1.00)
GFR, EST AFRICAN AMERICAN: 50 mL/min — AB (ref >60)
GFR, EST NON AFRICAN AMERICAN: 43 mL/min — AB (ref >60)
Glucose, Bld: 96 mg/dL (ref 65–99)
Potassium: 3.2 mmol/L — ABNORMAL LOW (ref 3.5–5.1)
Sodium: 125 mmol/L — ABNORMAL LOW (ref 135–145)
Total Bilirubin: 0.5 mg/dL (ref 0.3–1.2)
Total Protein: 6.1 g/dL — ABNORMAL LOW (ref 6.5–8.1)

## 2016-03-15 LAB — CULTURE, BLOOD (ROUTINE X 2)
Culture: NO GROWTH
Culture: NO GROWTH

## 2016-03-15 LAB — BASIC METABOLIC PANEL
Anion gap: 8 (ref 5–15)
BUN: 22 mg/dL — AB (ref 6–20)
CALCIUM: 8.2 mg/dL — AB (ref 8.9–10.3)
CHLORIDE: 86 mmol/L — AB (ref 101–111)
CO2: 30 mmol/L (ref 22–32)
CREATININE: 1.27 mg/dL — AB (ref 0.44–1.00)
GFR calc non Af Amer: 37 mL/min — ABNORMAL LOW (ref >60)
GFR, EST AFRICAN AMERICAN: 43 mL/min — AB (ref >60)
GLUCOSE: 120 mg/dL — AB (ref 65–99)
Potassium: 3.3 mmol/L — ABNORMAL LOW (ref 3.5–5.1)
Sodium: 124 mmol/L — ABNORMAL LOW (ref 135–145)

## 2016-03-15 MED ORDER — FUROSEMIDE 10 MG/ML IJ SOLN
80.0000 mg | Freq: Once | INTRAMUSCULAR | Status: AC
  Administered 2016-03-15: 80 mg via INTRAVENOUS
  Filled 2016-03-15: qty 8

## 2016-03-15 MED ORDER — POTASSIUM CHLORIDE CRYS ER 20 MEQ PO TBCR
40.0000 meq | EXTENDED_RELEASE_TABLET | Freq: Two times a day (BID) | ORAL | Status: AC
Start: 2016-03-15 — End: 2016-03-16
  Administered 2016-03-15 – 2016-03-16 (×3): 40 meq via ORAL
  Filled 2016-03-15 (×3): qty 2

## 2016-03-15 MED ORDER — ALUM & MAG HYDROXIDE-SIMETH 200-200-20 MG/5ML PO SUSP
30.0000 mL | ORAL | Status: DC | PRN
  Administered 2016-03-15 – 2016-03-16 (×3): 30 mL via ORAL
  Filled 2016-03-15 (×3): qty 30

## 2016-03-15 MED ORDER — BETHANECHOL CHLORIDE 10 MG PO TABS
10.0000 mg | ORAL_TABLET | Freq: Four times a day (QID) | ORAL | Status: DC
  Administered 2016-03-15 – 2016-03-19 (×17): 10 mg via ORAL
  Filled 2016-03-15 (×19): qty 1

## 2016-03-15 NOTE — Progress Notes (Signed)
[place] TEAM 1 - Stepdown/ICU ZAIA CARRE Tolleson  UXL:[id number] DOB: Jul 30, 1926 DOA: 03/13/2016 PCP: Ginette Otto, MD    Brief Narrative:  82 y.o. F with history of COPD, HTN, RA, and a hospitalization 03/10/2016-03/12/2016 when she was treated for a COPD exacerbation and community-acquired pneumonia. During that hospitalization she was found to be hyponatremic with a sodium of 129 felt to be secondary to SIADH. She was also found to have urinary retention and given Bethachol on day of discharge by Urology. Family members noted her blood pressures were gradually increasing over 24 hours.  The pt then reported acute pain and blurry vision in her left eye, with family noting the left pupil was significantly larger compared to her right. She presented as a code stroke.   In the ED a CT brain did not reveal an acute abnormality. CT angiogram did not show large vessel occlusion or intracranial aneurysm. She was evaluated by Neurology who did not feel this represented acute CVA. Lab work revealed a sodium of 123, decreased from 129 (03/12/2016). She was also hypertensive having systolic blood pressure of 200.  Subjective: The pt is much more alert today.  She is able to tell me where she is and the date, with some delay.  She denies cp, sob, n/v, or abdom pain.  She reports vision in the R sided portion of her OS vision only, but denies pain in the eye or signif nausea.  She is eating breakfast by herself w/o difficulty.    Assessment & Plan:  Phacomorphic glaucoma medical tx continues   Hyponatremia Admit Na 123, down from 129 the day prior - previously urine osmol 220, w/ serum osmol 275 which is NOT c/w SIADH, though earlier urine osmol was 368 and therefore more c/w SIADH  - TSH normal - Na is improving w/ lasix dosing - continue for today and follow   Recent Labs Lab 03/13/16 1314 03/13/16 1319 03/13/16 2147 03/14/16 0332 03/14/16 1345 03/15/16 0343  NA 123* 123* 121* 122* 122*  125*    Urinary retention foley catheter placed at admit - previous plan was for 5 day trial of bethanecol w/ I/O cath BID and monitoring - daughter reports no response to bethanechol prior to admission with 700-800cc of urine with I/O cath at home on multiple occasions - keep foley for now - resume urecholine and follow - mobilize   Acute encephalopathy Likely multifactorial to include recent steroids, urinary retention, hyponatremia, and sundowning - signif improved today   Hypokalemia Due to diuretic - cont to replace - Mg is ok   Hypertension BP now trending on low side - stop BB and follow w/ diuresis   Diastolic CHF  TTE 10/21/2015 w/ EF 60-65% with grade 2DD - lower extremity pitting edema on admit - baseline wgt appears to be ~62kg - ordered daily weights - cont diuresis as tolerated - watch BP and crt - no ACEi/ARB for now as crt is climbing w/ diuresis   Filed Weights   03/13/16 2015  Weight: 65.3 kg (143 lb 15.4 oz)    COPD w/ recent exacerbation  Well compensated at present  Chronic anemia  Likely related to chronic inflammation in setting of RA  Recent Labs Lab 03/11/16 0348 03/12/16 0316 03/13/16 1314 03/13/16 1319 03/14/16 0332 03/15/16 0343  HGB 8.6* 9.2* 10.4* 11.9* 9.1* 9.7*    RA  DVT prophylaxis: lovenox  Code Status: NO CODE BLUE / DNR  Family Communication: spoke w/ daughter at bedside at  length  Disposition Plan: SDU  Consultants:  Neurology  Opthalmology   Procedures: None  Antimicrobials:  None   Objective: Blood pressure (!) 105/47, pulse 66, temperature 98.4 F (36.9 C), temperature source Oral, resp. rate 19, height 5' (1.524 m), weight 65.3 kg (143 lb 15.4 oz), SpO2 100 %.  Intake/Output Summary (Last 24 hours) at 03/15/16 0854 Last data filed at 03/15/16 0800  Gross per 24 hour  Intake              720 ml  Output             4850 ml  Net            -4130 ml   Filed Weights   03/13/16 2015  Weight: 65.3 kg (143 lb  15.4 oz)    Examination: General: No acute respiratory distress - more alert  Lungs: Mild bibasilar crackles - no wheezing  Cardiovascular: RRR w/ frequent ectopic beats - 2/6 holosystolic murmur Abdomen: Nontender, nondistended, soft, bowel sounds positive, no rebound, no ascites Extremities: No significant cyanosis, or clubbing - trace edema bilateral lower extremities  CBC:  Recent Labs Lab 03/10/16 0148 03/11/16 0348 03/12/16 0316 03/13/16 1314 03/13/16 1319 03/14/16 0332 03/15/16 0343  WBC 13.0* 13.9* 11.0* 14.0*  --  13.8* 10.4  NEUTROABS 11.5*  --   --  12.5*  --   --   --   HGB 10.9* 8.6* 9.2* 10.4* 11.9* 9.1* 9.7*  HCT 35.3* 27.9* 30.4* 32.9* 35.0* 28.9* 31.0*  MCV 98.9 97.2 98.1 96.2  --  95.7 95.7  PLT 323 271 321 398  --  355 297   Basic Metabolic Panel:  Recent Labs Lab 03/13/16 1314 03/13/16 1319 03/13/16 2147 03/14/16 0332 03/14/16 1345 03/15/16 0343  NA 123* 123* 121* 122* 122* 125*  K 4.4 4.4 3.5 3.4* 3.7 3.2*  CL 86* 85* 89* 90* 89* 87*  CO2 28  --  24 23 22 30   GLUCOSE 168* 169* 143* 112* 103* 96  BUN 15 18 13 12 12 16   CREATININE 0.85 0.80 0.73 0.69 0.80 1.12*  CALCIUM 8.5*  --  8.1* 7.9* 8.0* 8.4*  MG  --   --   --   --  1.8  --    GFR: Estimated Creatinine Clearance: 29.8 mL/min (by C-G formula based on SCr of 1.12 mg/dL).  Liver Function Tests:  Recent Labs Lab 03/10/16 0148 03/13/16 1314 03/15/16 0343  AST 24 42* 15  ALT 10* 35 19  ALKPHOS 57 51 39  BILITOT 0.3 0.6 0.5  PROT 7.6 7.4 6.1*  ALBUMIN 2.8* 2.9* 2.3*    Coagulation Profile:  Recent Labs Lab 03/10/16 0438 03/13/16 1314  INR 1.07 1.05    Cardiac Enzymes:  Recent Labs Lab 03/10/16 0438 03/10/16 0934 03/10/16 1611  TROPONINI 0.41* 0.47* 0.30*    HbA1C: Hgb A1c MFr Bld  Date/Time Value Ref Range Status  03/10/2016 04:38 AM 6.2 (H) 4.8 - 5.6 % Final    Comment:    (NOTE)         Pre-diabetes: 5.7 - 6.4         Diabetes: >6.4         Glycemic  control for adults with diabetes: <7.0      Recent Results (from the past 240 hour(s))  Culture, blood (routine x 2) Call MD if unable to obtain prior to antibiotics being given     Status: None (Preliminary result)   Collection Time: 03/10/16  1:48 AM  Result Value Ref Range Status   Specimen Description BLOOD LEFT ARM  Final   Special Requests BOTTLES DRAWN AEROBIC AND ANAEROBIC 5CC EA  Final   Culture NO GROWTH 4 DAYS  Final   Report Status PENDING  Incomplete  Urine culture     Status: None   Collection Time: 03/10/16  3:10 AM  Result Value Ref Range Status   Specimen Description URINE, CLEAN CATCH  Final   Special Requests NONE  Final   Culture NO GROWTH  Final   Report Status 03/11/2016 FINAL  Final  Respiratory Panel by PCR     Status: None   Collection Time: 03/10/16  4:38 AM  Result Value Ref Range Status   Adenovirus NOT DETECTED NOT DETECTED Final   Coronavirus 229E NOT DETECTED NOT DETECTED Final   Coronavirus HKU1 NOT DETECTED NOT DETECTED Final   Coronavirus NL63 NOT DETECTED NOT DETECTED Final   Coronavirus OC43 NOT DETECTED NOT DETECTED Final   Metapneumovirus NOT DETECTED NOT DETECTED Final   Rhinovirus / Enterovirus NOT DETECTED NOT DETECTED Final   Influenza A NOT DETECTED NOT DETECTED Final   Influenza B NOT DETECTED NOT DETECTED Final   Parainfluenza Virus 1 NOT DETECTED NOT DETECTED Final   Parainfluenza Virus 2 NOT DETECTED NOT DETECTED Final   Parainfluenza Virus 3 NOT DETECTED NOT DETECTED Final   Parainfluenza Virus 4 NOT DETECTED NOT DETECTED Final   Respiratory Syncytial Virus NOT DETECTED NOT DETECTED Final   Bordetella pertussis NOT DETECTED NOT DETECTED Final   Chlamydophila pneumoniae NOT DETECTED NOT DETECTED Final   Mycoplasma pneumoniae NOT DETECTED NOT DETECTED Final  Culture, blood (routine x 2) Call MD if unable to obtain prior to antibiotics being given     Status: None (Preliminary result)   Collection Time: 03/10/16  4:38 AM  Result  Value Ref Range Status   Specimen Description BLOOD RIGHT ANTECUBITAL  Final   Special Requests BOTTLES DRAWN AEROBIC AND ANAEROBIC 5CC EA  Final   Culture NO GROWTH 4 DAYS  Final   Report Status PENDING  Incomplete  MRSA PCR Screening     Status: None   Collection Time: 03/13/16 10:52 PM  Result Value Ref Range Status   MRSA by PCR NEGATIVE NEGATIVE Final    Comment:        The GeneXpert MRSA Assay (FDA approved for NASAL specimens only), is one component of a comprehensive MRSA colonization surveillance program. It is not intended to diagnose MRSA infection nor to guide or monitor treatment for MRSA infections.      Scheduled Meds: . atenolol  25 mg Oral Daily  . brimonidine  1 drop Both Eyes TID  . dorzolamide-timolol  1 drop Both Eyes TID  . enoxaparin (LOVENOX) injection  40 mg Subcutaneous Q24H  . levofloxacin  500 mg Oral Q breakfast  . pilocarpine  1 drop Left Eye QID  . prednisoLONE acetate  1 drop Left Eye QID     LOS: 2 days   Lonia Blood, MD [facility] Office  [phone] Pager - Text Page per Amion as per below:  On-Call/Text Page:      [email or web address]      password [email or web address]  If 7PM-7AM, please contact night-coverage [email or web address] Password [facility] 03/15/2016, 8:54 AM

## 2016-03-15 NOTE — Evaluation (Signed)
Physical Therapy Evaluation Patient Details Name: Alexis Andrews MRN: [id number] DOB: May 27, 1927 Today's Date: 03/15/2016   History of Present Illness  82 y.o. F with history of COPD, HTN, RA, and a hospitalization 03/10/2016-03/12/2016 when she was treated for a COPD exacerbation and community-acquired pneumonia presents now for Phacomorphic glaucoma.  Clinical Impression  Patient demonstrates deficits in functional mobility as indicated below. Will need continued skilled PT to address deficits and maixmize function. Will see as indicated and progress as tolerated.     Follow Up Recommendations Home health PT;Supervision for mobility/OOB    Equipment Recommendations  None recommended by PT    Recommendations for Other Services       Precautions / Restrictions Precautions Precautions: Other (comment) (oxygen dependent since last admission 2 liters) Precaution Comments: monitor O2 sats Restrictions Weight Bearing Restrictions: No      Mobility  Bed Mobility Overal bed mobility: Needs Assistance Bed Mobility: Supine to Sit;Sit to Supine     Supine to sit: Min assist Sit to supine: Supervision   General bed mobility comments: increased time and effort to perform, min assist to rotate and elevte trunk to EOB, supervision to return to supine  Transfers Overall transfer level: Needs assistance Equipment used: 2 person hand held assist Transfers: Sit to/from Stand Sit to Stand: Min assist         General transfer comment: Min assist for stability in coming to upright  Ambulation/Gait Ambulation/Gait assistance: Min assist Ambulation Distance (Feet): 12 Feet Assistive device: 2 person hand held assist Gait Pattern/deviations: Step-through pattern;Decreased stride length;Narrow base of support Gait velocity: decreased Gait velocity interpretation: <1.8 ft/sec, indicative of risk for recurrent falls General Gait Details: Limited ambulation to to fatigue and generalized weakness  this session  Stairs            Wheelchair Mobility    Modified Rankin (Stroke Patients Only)       Balance     Sitting balance-Leahy Scale: Good     Standing balance support: Bilateral upper extremity supported Standing balance-Leahy Scale: Poor Standing balance comment: instability noted at this time                             Pertinent Vitals/Pain Pain Assessment: No/denies pain    Home Living Family/patient expects to be discharged to:: Private residence Living Arrangements: Children Available Help at Discharge: Family;Available 24 hours/day Type of Home: House       Home Layout: Two level Home Equipment: Walker - 2 wheels;Wheelchair - manual;Shower seat;Hand held shower head Additional Comments: pt was placed on O2 10 days ago, no history of needing it before that.     Prior Function Level of Independence: Needs assistance   Gait / Transfers Assistance Needed: mod I  ADL's / Homemaking Assistance Needed: Pt requires assist for shower, assist to don socks and shoes   Comments: pt was able to go up and down stairs to bedroom 4-5x/ day without trouble before getting sick. Has needed assistance since becoming sick     Hand Dominance   Dominant Hand: Right    Extremity/Trunk Assessment     RUE Deficits / Details: arthritic deformity noted bil.      LUE Deficits / Details: arthritic deformity noted bil.    Lower Extremity Assessment: Generalized weakness (+ arthritic changes noted BLEs)      Cervical / Trunk Assessment: Kyphotic  Communication   Communication: No difficulties  Cognition Arousal/Alertness: Awake/alert Behavior  During Therapy: WFL for tasks assessed/performed Overall Cognitive Status: Within Functional Limits for tasks assessed                      General Comments General comments (skin integrity, edema, etc.): O2 saturations on 2 liters >90%    Exercises        Assessment/Plan    PT Assessment  Patient needs continued PT services  PT Diagnosis Difficulty walking;Generalized weakness   PT Problem List Decreased strength;Decreased activity tolerance;Decreased balance;Decreased mobility;Decreased knowledge of use of DME;Decreased knowledge of precautions;Cardiopulmonary status limiting activity  PT Treatment Interventions DME instruction;Gait training;Stair training;Functional mobility training;Therapeutic activities;Therapeutic exercise;Balance training;Patient/family education   PT Goals (Current goals can be found in the Care Plan section) Acute Rehab PT Goals Patient Stated Goal: return home PT Goal Formulation: With patient Time For Goal Achievement: 03/24/16 Potential to Achieve Goals: Good    Frequency Min 3X/week   Barriers to discharge Inaccessible home environment      Co-evaluation               End of Session Equipment Utilized During Treatment: Gait belt;Oxygen Activity Tolerance: Patient tolerated treatment well;Patient limited by fatigue Patient left: in bed;with call bell/phone within reach;with family/visitor present Nurse Communication: Mobility status         Time: 6073-7106 PT Time Calculation (min) (ACUTE ONLY): 18 min   Charges:   PT Evaluation $PT Eval Moderate Complexity: 1 Procedure     PT G CodesFabio Asa 03-21-16, 1:50 PM Charlotte Crumb, PT DPT  [phone]

## 2016-03-16 LAB — COMPREHENSIVE METABOLIC PANEL
ALK PHOS: 39 U/L (ref 38–126)
ALT: 15 U/L (ref 14–54)
ANION GAP: 5 (ref 5–15)
AST: 14 U/L — ABNORMAL LOW (ref 15–41)
Albumin: 2.3 g/dL — ABNORMAL LOW (ref 3.5–5.0)
BILIRUBIN TOTAL: 0.3 mg/dL (ref 0.3–1.2)
BUN: 21 mg/dL — ABNORMAL HIGH (ref 6–20)
CALCIUM: 8.2 mg/dL — AB (ref 8.9–10.3)
CO2: 32 mmol/L (ref 22–32)
Chloride: 91 mmol/L — ABNORMAL LOW (ref 101–111)
Creatinine, Ser: 1.12 mg/dL — ABNORMAL HIGH (ref 0.44–1.00)
GFR, EST AFRICAN AMERICAN: 50 mL/min — AB (ref >60)
GFR, EST NON AFRICAN AMERICAN: 43 mL/min — AB (ref >60)
Glucose, Bld: 100 mg/dL — ABNORMAL HIGH (ref 65–99)
POTASSIUM: 3.9 mmol/L (ref 3.5–5.1)
Sodium: 128 mmol/L — ABNORMAL LOW (ref 135–145)
TOTAL PROTEIN: 5.9 g/dL — AB (ref 6.5–8.1)

## 2016-03-16 LAB — CBC
HEMATOCRIT: 29.6 % — AB (ref 36.0–46.0)
HEMOGLOBIN: 9.4 g/dL — AB (ref 12.0–15.0)
MCH: 30 pg (ref 26.0–34.0)
MCHC: 31.8 g/dL (ref 30.0–36.0)
MCV: 94.6 fL (ref 78.0–100.0)
Platelets: 302 10*3/uL (ref 150–400)
RBC: 3.13 MIL/uL — ABNORMAL LOW (ref 3.87–5.11)
RDW: 12.4 % (ref 11.5–15.5)
WBC: 10.6 10*3/uL — AB (ref 4.0–10.5)

## 2016-03-16 MED ORDER — ENOXAPARIN SODIUM 30 MG/0.3ML ~~LOC~~ SOLN
30.0000 mg | SUBCUTANEOUS | Status: DC
  Administered 2016-03-16 – 2016-03-18 (×3): 30 mg via SUBCUTANEOUS
  Filled 2016-03-16 (×3): qty 0.3

## 2016-03-16 MED ORDER — TIOTROPIUM BROMIDE MONOHYDRATE 18 MCG IN CAPS
18.0000 ug | ORAL_CAPSULE | Freq: Every day | RESPIRATORY_TRACT | Status: DC
  Administered 2016-03-17 – 2016-03-19 (×2): 18 ug via RESPIRATORY_TRACT
  Filled 2016-03-16 (×2): qty 5

## 2016-03-16 NOTE — Progress Notes (Signed)
[place] TEAM 1 - Stepdown/ICU JEMMIE LEDGERWOOD Pitera  ONG:[id number] DOB: Jul 09, 1927 DOA: 03/13/2016 PCP: Ginette Otto, MD    Brief Narrative:  82 y.o. F with history of COPD, HTN, RA, and a hospitalization 03/10/2016-03/12/2016 when she was treated for a COPD exacerbation and community-acquired pneumonia. During that hospitalization she was found to be hyponatremic with a sodium of 129 felt to be secondary to SIADH. She was also found to have urinary retention and given Bethachol on day of discharge by Urology. Family members noted her blood pressures were gradually increasing over 24 hours.  The pt then reported acute pain and blurry vision in her left eye, with family noting the left pupil was significantly larger compared to her right. She presented as a code stroke.   In the ED a CT brain did not reveal an acute abnormality. CT angiogram did not show large vessel occlusion or intracranial aneurysm. She was evaluated by Neurology who did not feel this represented acute CVA. Lab work revealed a sodium of 123, decreased from 129 (03/12/2016). She was also hypertensive having systolic blood pressure of 200.  Subjective:  The patient is steadily improving.  Her mental status is at her approximate baseline this morning.  She denies chest pain nausea vomiting or abdominal pain.  Her appetite is improving.  She denies shortness of breath.  She reports no pain in the left eye at this time.  Assessment & Plan:  Phacomorphic glaucoma medical tx continues   Hyponatremia Admit Na 123, down from 129 the day prior - previously urine osmol 220, w/ serum osmol 275 which is NOT c/w SIADH, though earlier urine osmol was 368 and therefore more c/w SIADH  - TSH normal - Na is improvrf w/ lasix dosing - lasix stopped due to worsening crt and low BP - Na continues to improve - recheck Na studies in AM off diuretic - cont fluid restriction for now   Recent Labs Lab 03/13/16 2147 03/14/16 0332  03/14/16 1345 03/15/16 0343 03/15/16 1505 03/16/16 0330  NA 121* 122* 122* 125* 124* 128*    Urinary retention foley catheter placed at admit - previous plan was for 5 day trial of bethanecol w/ I/O cath BID and monitoring - daughter reports no response to bethanechol prior to admission with 700-800cc of urine with I/O cath at home on multiple occasions - keep foley for now - cont urecholine - mobilize   Acute encephalopathy Likely multifactorial to include recent steroids, urinary retention, hyponatremia, and sundowning - essentially resolved at this time  Hypokalemia Due to diuretic - corrected  Hypertension Blood pressure has stabilized with discontinuation of beta blocker and diuretic  Diastolic CHF  TTE 10/21/2015 w/ EF 60-65% with grade 2DD - lower extremity pitting edema on admit - baseline wgt appears to be ~62kg - daily weights - watch BP and crt - no ACEi/ARB for now as crt climbing w/ diuresis - weight at baseline  [employer]   03/13/16 2015 03/16/16 0414  Weight: 65.3 kg (143 lb 15.4 oz) 62 kg (136 lb 11 oz)    COPD w/ recent exacerbation  Well compensated at present - resume home spiriva dose   Chronic anemia  Likely related to chronic inflammation in setting of RA - Hgb stable   Recent Labs Lab 03/12/16 0316 03/13/16 1314 03/13/16 1319 03/14/16 0332 03/15/16 0343 03/16/16 0330  HGB 9.2* 10.4* 11.9* 9.1* 9.7* 9.4*    RA Well-controlled at this time  DVT prophylaxis: lovenox  Code Status:  NO CODE BLUE / DNR  Family Communication: spoke w/ daughter at bedside  Disposition Plan: SDU  Consultants:  Neurology  Opthalmology   Procedures: None  Antimicrobials:  None   Objective: Blood pressure (!) 117/51, pulse 78, temperature 99.7 F (37.6 C), temperature source Oral, resp. rate (!) 26, height 5' (1.524 m), weight 62 kg (136 lb 11 oz), SpO2 98 %.  Intake/Output Summary (Last 24 hours) at 03/16/16 0948 Last data filed at 03/16/16 0322   Gross per 24 hour  Intake              600 ml  Output             1975 ml  Net            -1375 ml   Filed Weights   03/13/16 2015 03/16/16 0414  Weight: 65.3 kg (143 lb 15.4 oz) 62 kg (136 lb 11 oz)    Examination: General: No acute respiratory distress - Alert and conversant Lungs: Clear to auscultation throughout without wheeze Cardiovascular: RRR - 2/6 holosystolic murmur Abdomen: Nontender, nondistended, soft, bowel sounds positive, no rebound, no ascites Extremities: No significant cyanosis, clubbing, or edema bilateral lower extremities  CBC:  Recent Labs Lab 03/10/16 0148  03/12/16 0316 03/13/16 1314 03/13/16 1319 03/14/16 0332 03/15/16 0343 03/16/16 0330  WBC 13.0*  < > 11.0* 14.0*  --  13.8* 10.4 10.6*  NEUTROABS 11.5*  --   --  12.5*  --   --   --   --   HGB 10.9*  < > 9.2* 10.4* 11.9* 9.1* 9.7* 9.4*  HCT 35.3*  < > 30.4* 32.9* 35.0* 28.9* 31.0* 29.6*  MCV 98.9  < > 98.1 96.2  --  95.7 95.7 94.6  PLT 323  < > 321 398  --  355 297 302  < > = values in this interval not displayed.   Basic Metabolic Panel:  Recent Labs Lab 03/14/16 0332 03/14/16 1345 03/15/16 0343 03/15/16 1505 03/16/16 0330  NA 122* 122* 125* 124* 128*  K 3.4* 3.7 3.2* 3.3* 3.9  CL 90* 89* 87* 86* 91*  CO2 23 22 30 30  32  GLUCOSE 112* 103* 96 120* 100*  BUN 12 12 16  22* 21*  CREATININE 0.69 0.80 1.12* 1.27* 1.12*  CALCIUM 7.9* 8.0* 8.4* 8.2* 8.2*  MG  --  1.8  --   --   --    GFR: Estimated Creatinine Clearance: 29.1 mL/min (by C-G formula based on SCr of 1.12 mg/dL).  Liver Function Tests:  Recent Labs Lab 03/10/16 0148 03/13/16 1314 03/15/16 0343 03/16/16 0330  AST 24 42* 15 14*  ALT 10* 35 19 15  ALKPHOS 57 51 39 39  BILITOT 0.3 0.6 0.5 0.3  PROT 7.6 7.4 6.1* 5.9*  ALBUMIN 2.8* 2.9* 2.3* 2.3*    Coagulation Profile:  Recent Labs Lab 03/10/16 0438 03/13/16 1314  INR 1.07 1.05    Cardiac Enzymes:  Recent Labs Lab 03/10/16 0438 03/10/16 0934  03/10/16 1611  TROPONINI 0.41* 0.47* 0.30*    HbA1C: Hgb A1c MFr Bld  Date/Time Value Ref Range Status  03/10/2016 04:38 AM 6.2 (H) 4.8 - 5.6 % Final    Comment:    (NOTE)         Pre-diabetes: 5.7 - 6.4         Diabetes: >6.4         Glycemic control for adults with diabetes: <7.0      Recent Results (from  the past 240 hour(s))  Culture, blood (routine x 2) Call MD if unable to obtain prior to antibiotics being given     Status: None   Collection Time: 03/10/16  1:48 AM  Result Value Ref Range Status   Specimen Description BLOOD LEFT ARM  Final   Special Requests BOTTLES DRAWN AEROBIC AND ANAEROBIC 5CC EA  Final   Culture NO GROWTH 5 DAYS  Final   Report Status 03/15/2016 FINAL  Final  Urine culture     Status: None   Collection Time: 03/10/16  3:10 AM  Result Value Ref Range Status   Specimen Description URINE, CLEAN CATCH  Final   Special Requests NONE  Final   Culture NO GROWTH  Final   Report Status 03/11/2016 FINAL  Final  Respiratory Panel by PCR     Status: None   Collection Time: 03/10/16  4:38 AM  Result Value Ref Range Status   Adenovirus NOT DETECTED NOT DETECTED Final   Coronavirus 229E NOT DETECTED NOT DETECTED Final   Coronavirus HKU1 NOT DETECTED NOT DETECTED Final   Coronavirus NL63 NOT DETECTED NOT DETECTED Final   Coronavirus OC43 NOT DETECTED NOT DETECTED Final   Metapneumovirus NOT DETECTED NOT DETECTED Final   Rhinovirus / Enterovirus NOT DETECTED NOT DETECTED Final   Influenza A NOT DETECTED NOT DETECTED Final   Influenza B NOT DETECTED NOT DETECTED Final   Parainfluenza Virus 1 NOT DETECTED NOT DETECTED Final   Parainfluenza Virus 2 NOT DETECTED NOT DETECTED Final   Parainfluenza Virus 3 NOT DETECTED NOT DETECTED Final   Parainfluenza Virus 4 NOT DETECTED NOT DETECTED Final   Respiratory Syncytial Virus NOT DETECTED NOT DETECTED Final   Bordetella pertussis NOT DETECTED NOT DETECTED Final   Chlamydophila pneumoniae NOT DETECTED NOT DETECTED  Final   Mycoplasma pneumoniae NOT DETECTED NOT DETECTED Final  Culture, blood (routine x 2) Call MD if unable to obtain prior to antibiotics being given     Status: None   Collection Time: 03/10/16  4:38 AM  Result Value Ref Range Status   Specimen Description BLOOD RIGHT ANTECUBITAL  Final   Special Requests BOTTLES DRAWN AEROBIC AND ANAEROBIC 5CC EA  Final   Culture NO GROWTH 5 DAYS  Final   Report Status 03/15/2016 FINAL  Final  MRSA PCR Screening     Status: None   Collection Time: 03/13/16 10:52 PM  Result Value Ref Range Status   MRSA by PCR NEGATIVE NEGATIVE Final    Comment:        The GeneXpert MRSA Assay (FDA approved for NASAL specimens only), is one component of a comprehensive MRSA colonization surveillance program. It is not intended to diagnose MRSA infection nor to guide or monitor treatment for MRSA infections.      Scheduled Meds: . bethanechol  10 mg Oral QID  . brimonidine  1 drop Both Eyes TID  . dorzolamide-timolol  1 drop Both Eyes TID  . enoxaparin (LOVENOX) injection  40 mg Subcutaneous Q24H  . levofloxacin  500 mg Oral Q breakfast  . pilocarpine  1 drop Left Eye QID  . prednisoLONE acetate  1 drop Left Eye QID     LOS: 3 days   Lonia Blood, MD [facility] Office  [phone] Pager - Text Page per Amion as per below:  On-Call/Text Page:      [email or web address]      password [email or web address]  If 7PM-7AM, please contact night-coverage [email or web address] Password [facility] 03/16/2016, 9:48 AM

## 2016-03-17 LAB — COMPREHENSIVE METABOLIC PANEL
ALBUMIN: 2.5 g/dL — AB (ref 3.5–5.0)
ALK PHOS: 37 U/L — AB (ref 38–126)
ALT: 13 U/L — ABNORMAL LOW (ref 14–54)
ANION GAP: 6 (ref 5–15)
AST: 14 U/L — AB (ref 15–41)
BILIRUBIN TOTAL: 0.4 mg/dL (ref 0.3–1.2)
BUN: 15 mg/dL (ref 6–20)
CO2: 28 mmol/L (ref 22–32)
Calcium: 8.2 mg/dL — ABNORMAL LOW (ref 8.9–10.3)
Chloride: 95 mmol/L — ABNORMAL LOW (ref 101–111)
Creatinine, Ser: 0.99 mg/dL (ref 0.44–1.00)
GFR calc Af Amer: 58 mL/min — ABNORMAL LOW (ref >60)
GFR calc non Af Amer: 50 mL/min — ABNORMAL LOW (ref >60)
GLUCOSE: 110 mg/dL — AB (ref 65–99)
POTASSIUM: 4 mmol/L (ref 3.5–5.1)
SODIUM: 129 mmol/L — AB (ref 135–145)
TOTAL PROTEIN: 6 g/dL — AB (ref 6.5–8.1)

## 2016-03-17 LAB — OSMOLALITY: Osmolality: 276 mOsm/kg (ref 275–295)

## 2016-03-17 LAB — CBC
HEMATOCRIT: 30.4 % — AB (ref 36.0–46.0)
HEMOGLOBIN: 9.3 g/dL — AB (ref 12.0–15.0)
MCH: 29.9 pg (ref 26.0–34.0)
MCHC: 30.6 g/dL (ref 30.0–36.0)
MCV: 97.7 fL (ref 78.0–100.0)
Platelets: 336 10*3/uL (ref 150–400)
RBC: 3.11 MIL/uL — ABNORMAL LOW (ref 3.87–5.11)
RDW: 12.6 % (ref 11.5–15.5)
WBC: 9.8 10*3/uL (ref 4.0–10.5)

## 2016-03-17 LAB — OSMOLALITY, URINE: Osmolality, Ur: 370 mOsm/kg (ref 300–900)

## 2016-03-17 LAB — URIC ACID: Uric Acid, Serum: 4 mg/dL (ref 2.3–6.6)

## 2016-03-17 LAB — CORTISOL: Cortisol, Plasma: 8.5 ug/dL

## 2016-03-17 LAB — SODIUM, URINE, RANDOM: Sodium, Ur: 34 mmol/L

## 2016-03-17 MED ORDER — TRAMADOL HCL 50 MG PO TABS: 50.0000 mg | ORAL_TABLET | Freq: Four times a day (QID) | ORAL | Status: DC | PRN

## 2016-03-17 MED ORDER — LEVALBUTEROL HCL 0.63 MG/3ML IN NEBU: 0.6300 mg | INHALATION_SOLUTION | RESPIRATORY_TRACT | Status: DC | PRN

## 2016-03-17 NOTE — Progress Notes (Signed)
Report given to RN, Z for 6 north room 32

## 2016-03-17 NOTE — Evaluation (Signed)
Occupational Therapy Evaluation Patient Details Name: Alexis Andrews MRN: [id number] DOB: [birth year] Today's Date: 03/17/2016    History of Present Illness 82 y.o. 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 She demonstrates the below listed deficits and will benefit from continued OT to maximize safety and independence with BADLs.  This pt is familiar to me from last admission.  Currently, she requires min - mod A for ADLs.  Due to recent vision loss, she is at higher risk for falls and injury due to impaired depth perception.  She will need 24 hour supervision at home, and will need HHOT, preferably with an OT who has experience with/specialty with low vision rehab - have alerted CM re: this request.  Will follow acutely.       Follow Up Recommendations  Home health OT;Supervision/Assistance - 24 hour    Equipment Recommendations  None recommended by OT    Recommendations for Other Services       Precautions / Restrictions Precautions Precautions: Other (comment) Precaution Comments: monitor O2 sats      Mobility Bed Mobility Overal bed mobility: Needs Assistance Bed Mobility: Supine to Sit     Supine to sit: Min assist     General bed mobility comments: Pt lethargic and slow to move.   Transfers Overall transfer level: Needs assistance Equipment used: 1 person hand held assist Transfers: Sit to/from [employer] Sit to Stand: Min assist Stand pivot transfers: Min assist       General transfer comment: min A for balance     Balance Overall balance assessment: Needs assistance Sitting-balance support: Feet supported Sitting balance-Leahy Scale: Good     Standing balance support: Single extremity supported Standing  balance-Leahy Scale: Poor Standing balance comment: requires min A                             ADL Overall ADL's : Needs assistance/impaired Eating/Feeding: Set up   Grooming: Wash/dry hands;Wash/dry face;Oral care;Brushing hair;Minimal assistance;Standing   Upper Body Bathing: Minimal assitance;Sitting   Lower Body Bathing: Moderate assistance;Sit to/from stand   Upper Body Dressing : Minimal assistance;Sitting   Lower Body Dressing: Maximal assistance;Sit to/from stand   Toilet Transfer: Minimal assistance;Ambulation;Comfort height toilet;Grab bars   Toileting- Clothing Manipulation and Hygiene: Moderate assistance;Sit to/from stand       Functional mobility during ADLs: Minimal assistance       Vision Additional Comments: Pt is able to read clock on wall, and able to read large print signs.  She does complain about glare.   She is unable to read small print.  Daughter is very concerned about pt being able to occupy her day and how to engage her as she is an avid reader.   Discussed some of the low vision adaptive equipment that is available, as well as community options    Perception     Praxis      Pertinent Vitals/Pain Pain Assessment: No/denies pain     Hand Dominance Right   Extremity/Trunk Assessment Upper Extremity Assessment Upper Extremity Assessment: RUE deficits/detail;LUE deficits/detail RUE Deficits / Details: arthritic deformity noted bil.  RUE Coordination: decreased fine motor LUE Deficits / Details: arthritic deformity noted bil.    Lower Extremity Assessment Lower Extremity Assessment:  Defer to PT evaluation   Cervical / Trunk Assessment Cervical / Trunk Assessment: Kyphotic   Communication Communication Communication: No difficulties   Cognition Arousal/Alertness: Awake/alert Behavior During Therapy: WFL for tasks assessed/performed Overall Cognitive Status: Within Functional Limits for tasks assessed                      General Comments       Exercises       Shoulder Instructions      Home Living Family/patient expects to be discharged to:: Private residence Living Arrangements: Children Available Help at Discharge: Family;Available 24 hours/day Type of Home: House       Home Layout: Two level Alternate Level Stairs-Number of Steps: 17 Alternate Level Stairs-Rails: Right Bathroom Shower/Tub: Tub/shower unit Shower/tub characteristics: Curtain Firefighter: Standard Bathroom Accessibility: Yes   Home Equipment: Environmental consultant - 2 wheels;Wheelchair - manual;Shower seat;Hand held shower head   Additional Comments: Pt was placed on 02 ~ 2weeks prior to this admission       Prior Functioning/Environment Level of Independence: Needs assistance  Gait / Transfers Assistance Needed: mod I ADL's / Homemaking Assistance Needed: Pt requires assist for shower, assist to don socks and shoes    Comments: Daughter reports pt is an avid reader, reads the newspaper daily, works the crossword puzzles and soduko every day to occupy her time     OT Diagnosis: Generalized weakness;Disturbance of vision   OT Problem List: Decreased strength;Decreased activity tolerance;Impaired balance (sitting and/or standing);Impaired vision/perception;Decreased coordination;Decreased knowledge of use of DME or AE;Cardiopulmonary status limiting activity;Impaired UE functional use   OT Treatment/Interventions: Self-care/ADL training;Energy conservation;DME and/or AE instruction;Therapeutic activities;Visual/perceptual remediation/compensation;Patient/family education;Balance training    OT Goals(Current goals can be found in the care plan section) Acute Rehab OT Goals Patient Stated Goal: return home OT Goal Formulation: With patient/family Time For Goal Achievement: 03/31/16 Potential to Achieve Goals: Good ADL Goals Pt Will Perform Grooming: with supervision;standing Pt Will Perform Upper Body Bathing: with  set-up;sitting Pt Will Perform Lower Body Bathing: with min assist;sit to/from stand Pt Will Perform Upper Body Dressing: with min assist;sitting Pt Will Perform Lower Body Dressing: with min assist;sit to/from stand Pt Will Transfer to Toilet: with supervision;ambulating;regular height toilet;grab bars Additional ADL Goal #1: Pt and daughter will verbalize understanding re: low vision community resources and equipment   OT Frequency: Min 2X/week   Barriers to D/C:            Co-evaluation              End of Session Nurse Communication: Mobility status;Other (comment) (need for geo mat pressure relieving cushion )  Activity Tolerance: Patient limited by fatigue Patient left: in chair;with call bell/phone within reach;with family/visitor present   Time: 3785-8850 OT Time Calculation (min): 41 min Charges:  OT General Charges $OT Visit: 1 Procedure OT Evaluation $OT Eval Moderate Complexity: 1 Procedure OT Treatments $Self Care/Home Management : 8-22 mins $Therapeutic Activity: 8-22 mins G-Codes:    Arrow Tomko M 03/27/16, 12:52 PM

## 2016-03-17 NOTE — Progress Notes (Signed)
Physical Therapy Treatment Patient Details Name: Alexis Andrews MRN: [id number] DOB: 1927-04-15 Today's Date: 03/17/2016    History of Present Illness 82 y.o. 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  Will continue to follow patient during acute hospitalization and advance mobility without device if able.  RW right now is best for energy conservation.    Follow Up Recommendations  Home health PT;Supervision for mobility/OOB     Equipment Recommendations  None recommended by PT    Recommendations for Other Services       Precautions / Restrictions Precautions Precautions: Other (comment) Precaution Comments: monitor O2 sats (unable to obtain reading from portable pulse oximeter.  ) Restrictions Weight Bearing Restrictions: No    Mobility  Bed Mobility Overal bed mobility: Needs Assistance Bed Mobility: Supine to Sit     Supine to sit: Min assist     General bed mobility comments: Pt lethargic and slow to move. Used grandaughters arms to pull on into sitting.  Increased time to scoot to edge of bed but no assist needed.    Transfers Overall transfer level: Needs assistance Equipment used: Rolling walker (2 wheeled) (youth height.  ) Transfers: Sit to/from Stand Sit to Stand: Min assist         General transfer comment: Cues for hand placement.  Pt pulls on RW for support.  Required cues to push from seated surface.    Ambulation/Gait Ambulation/Gait assistance: Min guard Ambulation Distance (Feet): 110 Feet (x2 trials.  Rest break seated inbetween trials.  ) Assistive device: Rolling walker (2 wheeled) Gait Pattern/deviations: Step-through pattern;Shuffle;Decreased stride length;Trunk flexed Gait  velocity: decreased Gait velocity interpretation: Below normal speed for age/gender General Gait Details: Cues for forward gaze, upper trunk control, and hip extension.  Utilization of RW improved endurance to allow for advanced gait distance.     Stairs            Wheelchair Mobility    Modified Rankin (Stroke Patients Only)       Balance Overall balance assessment: Needs assistance   Sitting balance-Leahy Scale: Good       Standing balance-Leahy Scale: Fair                      Cognition Arousal/Alertness: Awake/alert Behavior During Therapy: WFL for tasks assessed/performed Overall Cognitive Status: Within Functional Limits for tasks assessed                      Exercises      General Comments        Pertinent Vitals/Pain Pain Assessment: No/denies pain    Home Living                      Prior Function            PT Goals (current goals can now be found in the care plan section) Acute Rehab PT Goals Patient Stated Goal: return home Potential to Achieve Goals: Good Progress towards PT goals: Progressing toward goals    Frequency  Min 3X/week    PT Plan Current plan remains appropriate    Co-evaluation             End of Session  Equipment Utilized During Treatment: Gait belt;Oxygen Activity Tolerance: Patient tolerated treatment well;Patient limited by fatigue Patient left: with call bell/phone within reach;with family/visitor present;in chair     Time: 1535-1620 PT Time Calculation (min) (ACUTE ONLY): 45 min  Charges:  $Gait Training: 23-37 mins $Therapeutic Activity: 8-22 mins                    G Codes:      Alexis Andrews 22-Mar-2016, 4:30 PM  Joycelyn Rua, PTA pager [phone]

## 2016-03-17 NOTE — Care Management Note (Addendum)
Case Management Note  Patient Details  Name: Alexis Andrews MRN: [id number] Date of Birth: [birth year]  Subjective/Objective:      Patient was set up with [name] by previous NCM for [facility] and [facility].  Patient lives with her daughter, [name].   NCM received call from rep with [name] stating that patient is with them and she is following along.  She has home oxygen with [facility]. Per OT states that patient will also need HHOT for low vision.  NCM made additional referral to [facility] for this service today.  Rep on phone states she will give information to administrator and they will let me know if they have low vision with occupational services.   NCM received call from Roxanne with [name]( [facility]) states she will get hhot (vision loss) set up.                          Action/Plan:   Expected Discharge Date:  03/15/16               Expected Discharge Plan:  Home w Home Health Services  In-House Referral:     Discharge planning Services  CM Consult  Post Acute Care Choice:  Resumption of Svcs/PTA Provider Choice offered to:     DME Arranged:    DME Agency:     HH Arranged:  RN, PT, OT (ot for low vision) HH Agency:  [facility] (now [facility])  Status of Service:  Completed, signed off  If discussed at Long Length of Stay Meetings, dates discussed:    Additional Comments:  Leone Haven, RN 03/17/2016, 2:28 PM

## 2016-03-17 NOTE — Progress Notes (Signed)
[place] TEAM 1 - Stepdown/ICU WENDELL FIEBIG Crunk  KPV:[id number] DOB: [birth year] DOA: 03/13/2016 PCP: Ginette Otto, MD    Brief Narrative:  82 y.o. F with history of COPD, HTN, RA, and a hospitalization 03/10/2016-03/12/2016 when she was treated for a COPD exacerbation and community-acquired pneumonia. During that hospitalization she was found to be hyponatremic with a sodium of 129 felt to be secondary to SIADH. She was also found to have urinary retention and given Bethachol on day of discharge by Urology. Family members noted her blood pressures were gradually increasing over 24 hours.  The pt then reported acute pain and blurry vision in her left eye, with family noting the left pupil was significantly larger compared to her right. She presented as a code stroke.   In the ED a CT brain did not reveal an acute abnormality. CT angiogram did not show large vessel occlusion or intracranial aneurysm. She was evaluated by Neurology who did not feel this represented acute CVA. Lab work revealed a sodium of 123, decreased from 129 (03/12/2016). She was also hypertensive having systolic blood pressure of 200.  Subjective: Patient is sitting up in a bedside chair and looks the best I have seen her since this admission.  She presently denies pain in left or right eye but her family reports she does have intermittent left eye pain.  She denies any change in the visual acuity in the L eye over the last 48 hours.  She denies chest pain shortness breath fevers chills.  She reports her appetite is improving.  Assessment & Plan:  Phacomorphic glaucoma medical tx continues   Hyponatremia admit Na 123, down from 129 the day prior - previously urine osmol 220, w/ serum osmol 275 which is NOT c/w SIADH, though earlier urine osmol was 368 and therefore more c/w SIADH  - TSH normal - Na improved w/ lasix dosing - lasix stopped due to worsening crt and low BP - Na continues to improve - recheck Na not  presently convincing of SIADH - my suspicion is that this is more indicative of fluid overload - follow w/o diuretic for now   Recent Labs Lab 03/14/16 0332 03/14/16 1345 03/15/16 0343 03/15/16 1505 03/16/16 0330 03/17/16 0239  NA 122* 122* 125* 124* 128* 129*    Urinary retention foley catheter placed at admit - previous plan was for 5 day trial of bethanecol w/ I/O cath BID and monitoring - daughter reports no response to bethanechol prior to admission with 700-800cc of urine with I/O cath at home on multiple occasions - cont urecholine - mobilize - remove foley for voiding trial today - if bladder volume above 300cc w/o output will I/O x1, but if recurs after that will replace foley for now   Acute encephalopathy Likely multifactorial to include recent steroids, urinary retention, hyponatremia, and sundowning - essentially resolved at this time  Hypokalemia Due to diuretic - corrected  Frequent PACs Cont to follow on tele for now - lytes optimized   Hypertension Blood pressure has stabilized with discontinuation of beta blocker and diuretic  Diastolic CHF  TTE 10/21/2015 w/ EF 60-65% with grade 2DD - lower extremity pitting edema on admit - baseline wgt appears to be ~62kg - daily weights - watch BP and crt - no ACEi/ARB for now as crt climbing w/ diuresis - weight at baseline w/ no clinical evidence of overload   Filed Weights   03/13/16 2015 03/16/16 0414 03/17/16 0407  Weight: 65.3 kg (143 lb 15.4  oz) 62 kg (136 lb 11 oz) 62.2 kg (137 lb 2 oz)    COPD w/ recent exacerbation  Well compensated at present on home meds   Chronic anemia  Likely related to chronic inflammation in setting of RA - Hgb stable   Recent Labs Lab 03/13/16 1314 03/13/16 1319 03/14/16 0332 03/15/16 0343 03/16/16 0330 03/17/16 0239  HGB 10.4* 11.9* 9.1* 9.7* 9.4* 9.3*    RA Well-controlled at this time  DVT prophylaxis: lovenox  Code Status: NO CODE BLUE / DNR  Family Communication:  spoke w/ daughter at bedside  Disposition Plan: Transfer to telemetry bed - mobilize - follow sodium for at least 48 hours more before considering discharge - attempt to correct urinary retention  Consultants:  Neurology  Opthalmology   Procedures: None  Antimicrobials:  None   Objective: Blood pressure (!) 123/53, pulse 62, temperature 98.3 F (36.8 C), temperature source Oral, resp. rate (!) 23, height 5' (1.524 m), weight 62.2 kg (137 lb 2 oz), SpO2 99 %.  Intake/Output Summary (Last 24 hours) at 03/17/16 1145 Last data filed at 03/17/16 1100  Gross per 24 hour  Intake             1055 ml  Output             1560 ml  Net             -505 ml   Filed Weights   03/13/16 2015 03/16/16 0414 03/17/16 0407  Weight: 65.3 kg (143 lb 15.4 oz) 62 kg (136 lb 11 oz) 62.2 kg (137 lb 2 oz)    Examination: General: No acute respiratory distress - alert  Lungs: Clear to auscultation throughout without wheeze or crackles  Cardiovascular: RRR w/ frequent ectopic beats  Abdomen: Nontender, nondistended, soft, bowel sounds positive, no rebound, no ascites Extremities: No significant cyanosis, clubbing, edema bilateral lower extremities  CBC:  Recent Labs Lab 03/13/16 1314 03/13/16 1319 03/14/16 0332 03/15/16 0343 03/16/16 0330 03/17/16 0239  WBC 14.0*  --  13.8* 10.4 10.6* 9.8  NEUTROABS 12.5*  --   --   --   --   --   HGB 10.4* 11.9* 9.1* 9.7* 9.4* 9.3*  HCT 32.9* 35.0* 28.9* 31.0* 29.6* 30.4*  MCV 96.2  --  95.7 95.7 94.6 97.7  PLT 398  --  355 297 302 336     Basic Metabolic Panel:  Recent Labs Lab 03/14/16 1345 03/15/16 0343 03/15/16 1505 03/16/16 0330 03/17/16 0239  NA 122* 125* 124* 128* 129*  K 3.7 3.2* 3.3* 3.9 4.0  CL 89* 87* 86* 91* 95*  CO2 22 30 30  32 28  GLUCOSE 103* 96 120* 100* 110*  BUN 12 16 22* 21* 15  CREATININE 0.80 1.12* 1.27* 1.12* 0.99  CALCIUM 8.0* 8.4* 8.2* 8.2* 8.2*  MG 1.8  --   --   --   --    GFR: Estimated Creatinine Clearance: 33  mL/min (by C-G formula based on SCr of 0.99 mg/dL).  Liver Function Tests:  Recent Labs Lab 03/13/16 1314 03/15/16 0343 03/16/16 0330 03/17/16 0239  AST 42* 15 14* 14*  ALT 35 19 15 13*  ALKPHOS 51 39 39 37*  BILITOT 0.6 0.5 0.3 0.4  PROT 7.4 6.1* 5.9* 6.0*  ALBUMIN 2.9* 2.3* 2.3* 2.5*    Coagulation Profile:  Recent Labs Lab 03/13/16 1314  INR 1.05    Cardiac Enzymes:  Recent Labs Lab 03/10/16 1611  TROPONINI 0.30*    HbA1C:  Hgb A1c MFr Bld  Date/Time Value Ref Range Status  03/10/2016 04:38 AM 6.2 (H) 4.8 - 5.6 % Final    Comment:    (NOTE)         Pre-diabetes: 5.7 - 6.4         Diabetes: >6.4         Glycemic control for adults with diabetes: <7.0      Recent Results (from the past 240 hour(s))  Culture, blood (routine x 2) Call MD if unable to obtain prior to antibiotics being given     Status: None   Collection Time: 03/10/16  1:48 AM  Result Value Ref Range Status   Specimen Description BLOOD LEFT ARM  Final   Special Requests BOTTLES DRAWN AEROBIC AND ANAEROBIC 5CC EA  Final   Culture NO GROWTH 5 DAYS  Final   Report Status 03/15/2016 FINAL  Final  Urine culture     Status: None   Collection Time: 03/10/16  3:10 AM  Result Value Ref Range Status   Specimen Description URINE, CLEAN CATCH  Final   Special Requests NONE  Final   Culture NO GROWTH  Final   Report Status 03/11/2016 FINAL  Final  Respiratory Panel by PCR     Status: None   Collection Time: 03/10/16  4:38 AM  Result Value Ref Range Status   Adenovirus NOT DETECTED NOT DETECTED Final   Coronavirus 229E NOT DETECTED NOT DETECTED Final   Coronavirus HKU1 NOT DETECTED NOT DETECTED Final   Coronavirus NL63 NOT DETECTED NOT DETECTED Final   Coronavirus OC43 NOT DETECTED NOT DETECTED Final   Metapneumovirus NOT DETECTED NOT DETECTED Final   Rhinovirus / Enterovirus NOT DETECTED NOT DETECTED Final   Influenza A NOT DETECTED NOT DETECTED Final   Influenza B NOT DETECTED NOT DETECTED  Final   Parainfluenza Virus 1 NOT DETECTED NOT DETECTED Final   Parainfluenza Virus 2 NOT DETECTED NOT DETECTED Final   Parainfluenza Virus 3 NOT DETECTED NOT DETECTED Final   Parainfluenza Virus 4 NOT DETECTED NOT DETECTED Final   Respiratory Syncytial Virus NOT DETECTED NOT DETECTED Final   Bordetella pertussis NOT DETECTED NOT DETECTED Final   Chlamydophila pneumoniae NOT DETECTED NOT DETECTED Final   Mycoplasma pneumoniae NOT DETECTED NOT DETECTED Final  Culture, blood (routine x 2) Call MD if unable to obtain prior to antibiotics being given     Status: None   Collection Time: 03/10/16  4:38 AM  Result Value Ref Range Status   Specimen Description BLOOD RIGHT ANTECUBITAL  Final   Special Requests BOTTLES DRAWN AEROBIC AND ANAEROBIC 5CC EA  Final   Culture NO GROWTH 5 DAYS  Final   Report Status 03/15/2016 FINAL  Final  MRSA PCR Screening     Status: None   Collection Time: 03/13/16 10:52 PM  Result Value Ref Range Status   MRSA by PCR NEGATIVE NEGATIVE Final    Comment:        The GeneXpert MRSA Assay (FDA approved for NASAL specimens only), is one component of a comprehensive MRSA colonization surveillance program. It is not intended to diagnose MRSA infection nor to guide or monitor treatment for MRSA infections.      Scheduled Meds: . bethanechol  10 mg Oral QID  . brimonidine  1 drop Both Eyes TID  . dorzolamide-timolol  1 drop Both Eyes TID  . enoxaparin (LOVENOX) injection  30 mg Subcutaneous Q24H  . pilocarpine  1 drop Left Eye QID  . prednisoLONE  acetate  1 drop Left Eye QID  . tiotropium  18 mcg Inhalation Daily     LOS: 4 days   Lonia BloodJeffrey T. Dessa Ledee, MD [facility] Office  [phone] Pager - Text Page per Amion as per below:  On-Call/Text Page:      [email or web address]      password [email or web address]  If 7PM-7AM, please contact night-coverage [email or web address] Password [email or web address] 03/17/2016, 11:45 AM

## 2016-03-18 DIAGNOSIS — H538 Other visual disturbances: Secondary | ICD-10-CM

## 2016-03-18 LAB — BASIC METABOLIC PANEL
ANION GAP: 4 — AB (ref 5–15)
BUN: 12 mg/dL (ref 6–20)
CALCIUM: 8.5 mg/dL — AB (ref 8.9–10.3)
CO2: 32 mmol/L (ref 22–32)
Chloride: 96 mmol/L — ABNORMAL LOW (ref 101–111)
Creatinine, Ser: 0.85 mg/dL (ref 0.44–1.00)
GFR calc Af Amer: >60 mL/min (ref >60)
GFR, EST NON AFRICAN AMERICAN: 60 mL/min — AB (ref >60)
Glucose, Bld: 101 mg/dL — ABNORMAL HIGH (ref 65–99)
POTASSIUM: 3.8 mmol/L (ref 3.5–5.1)
SODIUM: 132 mmol/L — AB (ref 135–145)

## 2016-03-18 MED ORDER — POLYETHYLENE GLYCOL 3350 17 G PO PACK
17.0000 g | PACK | Freq: Two times a day (BID) | ORAL | Status: DC
  Administered 2016-03-18: 17 g via ORAL
  Filled 2016-03-18 (×2): qty 1

## 2016-03-18 NOTE — Progress Notes (Signed)
Occupational Therapy Treatment Patient Details Name: Alexis Andrews MRN: [id number] DOB: March 13, 1927 Today's Date: 03/18/2016    History of present illness 82 y.o. 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  Pt and daughter instructed in, and provided with written info re: community resources for low vision, types of magnifiers, and impact of lighting on visual performance.  Recommend HHOT.  Follow Up Recommendations  Home health OT;Supervision/Assistance - 24 hour    Equipment Recommendations  None recommended by OT    Recommendations for Other Services      Precautions / Restrictions         Mobility Bed Mobility Overal bed mobility: Needs Assistance Bed Mobility: Supine to Sit     Supine to sit: Supervision        Transfers Overall transfer level: Needs assistance Equipment used: 1 person hand held assist Transfers: Sit to/from Stand;Stand Pivot Transfers Sit to Stand: Min assist Stand pivot transfers: Min assist            Balance     Sitting balance-Leahy Scale: Good       Standing balance-Leahy Scale: Fair                     ADL Overall ADL's : Needs assistance/impaired                         Toilet Transfer: Minimal assistance;Ambulation;Comfort height toilet;Grab bars   Toileting- Clothing Manipulation and Hygiene: Minimal assistance;Sit to/from stand                Vision                 Additional Comments: Pt and daugher were provided with written info re: community resources for low vision, types of magnifiers, lighting, and low vision equipment.     Perception     Praxis      Cognition   Behavior During Therapy: WFL for tasks assessed/performed Overall Cognitive Status:  Within Functional Limits for tasks assessed                       Extremity/Trunk Assessment               Exercises     Shoulder Instructions       General Comments      Pertinent Vitals/ Pain       Pain Assessment: No/denies pain  Home Living                                          Prior Functioning/Environment              Frequency Min 2X/week     Progress Toward Goals  OT Goals(current goals can now be found in the care plan section)     Acute Rehab OT Goals Patient Stated Goal: return home OT Goal Formulation: With patient/family Time For Goal Achievement: 03/31/16 Potential to Achieve Goals: Good ADL Goals Pt Will Perform Grooming: with supervision;standing Pt Will Perform Upper Body Bathing: with set-up;sitting Pt Will Perform Lower Body Bathing: with min assist;sit to/from stand Pt Will Perform  Upper Body Dressing: with min assist;sitting Pt Will Perform Lower Body Dressing: with min assist;sit to/from stand Pt Will Transfer to Toilet: with supervision;ambulating;regular height toilet;grab bars Pt Will Perform Toileting - Clothing Manipulation and hygiene: with supervision;sit to/from stand Additional ADL Goal #1: Pt and daughter will verbalize understanding re: low vision community resources and equipment   Plan Discharge plan remains appropriate    Co-evaluation                 End of Session Equipment Utilized During Treatment: Oxygen   Activity Tolerance Patient limited by fatigue   Patient Left Other (comment) (in BR with daughter present )   Nurse Communication Mobility status        Time: 0254-2706 OT Time Calculation (min): 21 min  Charges: OT General Charges $OT Visit: 1 Procedure OT Treatments $Self Care/Home Management : 8-22 mins  Audris Speaker M 03/18/2016, 1:54 PM

## 2016-03-18 NOTE — Progress Notes (Signed)
[place] TEAM 1 - Stepdown/ICU HASSANA WORNER Arechiga  KXF:[id number] DOB: Dec 27, 1926 DOA: 03/13/2016 PCP: Ginette Otto, MD    Brief Narrative:  82 y.o. F with history of COPD, HTN, RA, and a hospitalization 03/10/2016-03/12/2016 when she was treated for a COPD exacerbation and community-acquired pneumonia. During that hospitalization she was found to be hyponatremic with a sodium of 129 felt to be secondary to SIADH. She was also found to have urinary retention and given Bethachol on day of discharge by Urology. Family members noted her blood pressures were gradually increasing over 24 hours.  The pt then reported acute pain and blurry vision in her left eye, with family noting the left pupil was significantly larger compared to her right. She presented as a code stroke.   In the ED a CT brain did not reveal an acute abnormality. CT angiogram did not show large vessel occlusion or intracranial aneurysm. She was evaluated by Neurology who did not feel this represented acute CVA. Lab work revealed a sodium of 123, decreased from 129 (03/12/2016). She was also hypertensive having systolic blood pressure of 200.  Subjective:   denies pain in left or right eye but her family reports she does have intermittent left eye pain.  She denies any change in the visual acuity in the L eye over the last 48 hours.  She denies chest pain shortness breath fevers chills.  She is voiding without difficulty, had 80% of her breakfast, daughter by the bedside and feels that the patient is improving  Assessment & Plan:  Phacomorphic glaucoma medical tx continues  Patient has been seen by ophthalmology this admission and will need outpatient follow-up with Maris Berger, MD  Hyponatremia Sodium improved from 122->132 - previously urine osmol 220, w/ serum osmol 275 which is NOT c/w SIADH, though earlier urine osmol was 368 and therefore more c/w SIADH  - TSH normal - Na improved w/ lasix dosing - lasix stopped  due to worsening  creatinine and low BP - Na continues to improve with fluid restriction- recheck Na not presently convincing of SIADH - my suspicion is that this is more indicative of fluid overload -continue patient on fluid restriction.   Urinary retention foley catheter placed at admit - previous plan was for 5 day trial of bethanecol w/ I/O cath BID and monitoring - daughter reports no response to bethanechol prior to admission with 700-800cc of urine with I/O cath at home on multiple occasions -  patient is currently on bethanechol and is voiding - mobilize -  if bladder volume above 300cc w/o output will I/O x1, but if recurs after that will replace foley for now , patient is currently voiding  Acute encephalopathy-resolved Likely multifactorial to include recent steroids, urinary retention, hyponatremia, and sundowning - essentially resolved at this time  Hypokalemia Due to diuretic - corrected  Frequent PACs Cont to follow on tele for now - lytes optimized   Hypertension Blood pressure has stabilized with discontinuation of beta blocker and diuretic  Diastolic CHF  TTE 10/21/2015 w/ EF 60-65% with grade 2DD - lower extremity pitting edema on admit - baseline wgt appears to be ~62kg - daily weights - watch BP and crt - no ACEi/ARB for now as crt climbing w/ diuresis - weight at baseline w/ no clinical evidence of overload . Does not take diuretics at home    COPD w/ recent exacerbation  Well compensated at present on home meds , chronically on 2 L at home  Chronic anemia  Likely related to chronic inflammation in setting of RA - Hgb stable    RA Well-controlled at this time  Constipation-patient started on MiraLAX     DVT prophylaxis: lovenox  Code Status: NO CODE BLUE / DNR  Family Communication: spoke w/ daughter at bedside  Disposition Plan:  mobilize - anticipate discharge tomorrow -  daughter would like to take the patient home with home health  Consultants:    Neurology  Opthalmology   Procedures: None  Antimicrobials:  None   Objective: Blood pressure (!) 123/48, pulse 73, temperature 98.2 F (36.8 C), resp. rate 15, height 5' (1.524 m), weight 63.1 kg (139 lb 0.6 oz), SpO2 97 %.  Intake/Output Summary (Last 24 hours) at 03/18/16 0814 Last data filed at 03/18/16 0500  Gross per 24 hour  Intake              195 ml  Output              950 ml  Net             -755 ml   Filed Weights   03/16/16 0414 03/17/16 0407 03/18/16 0605  Weight: 62 kg (136 lb 11 oz) 62.2 kg (137 lb 2 oz) 63.1 kg (139 lb 0.6 oz)    Examination: General: No acute respiratory distress - alert  Lungs: Clear to auscultation throughout without wheeze or crackles  Cardiovascular: RRR w/ frequent ectopic beats  Abdomen: Nontender, nondistended, soft, bowel sounds positive, no rebound, no ascites Extremities: No significant cyanosis, clubbing, edema bilateral lower extremities  CBC:  Recent Labs Lab 03/13/16 1314 03/13/16 1319 03/14/16 0332 03/15/16 0343 03/16/16 0330 03/17/16 0239  WBC 14.0*  --  13.8* 10.4 10.6* 9.8  NEUTROABS 12.5*  --   --   --   --   --   HGB 10.4* 11.9* 9.1* 9.7* 9.4* 9.3*  HCT 32.9* 35.0* 28.9* 31.0* 29.6* 30.4*  MCV 96.2  --  95.7 95.7 94.6 97.7  PLT 398  --  355 297 302 336     Basic Metabolic Panel:  Recent Labs Lab 03/14/16 1345 03/15/16 0343 03/15/16 1505 03/16/16 0330 03/17/16 0239 03/18/16 0517  NA 122* 125* 124* 128* 129* 132*  K 3.7 3.2* 3.3* 3.9 4.0 3.8  CL 89* 87* 86* 91* 95* 96*  CO2 22 30 30  32 28 32  GLUCOSE 103* 96 120* 100* 110* 101*  BUN 12 16 22* 21* 15 12  CREATININE 0.80 1.12* 1.27* 1.12* 0.99 0.85  CALCIUM 8.0* 8.4* 8.2* 8.2* 8.2* 8.5*  MG 1.8  --   --   --   --   --    GFR: Estimated Creatinine Clearance: 38.6 mL/min (by C-G formula based on SCr of 0.85 mg/dL).  Liver Function Tests:  Recent Labs Lab 03/13/16 1314 03/15/16 0343 03/16/16 0330 03/17/16 0239  AST 42* 15 14* 14*  ALT  35 19 15 13*  ALKPHOS 51 39 39 37*  BILITOT 0.6 0.5 0.3 0.4  PROT 7.4 6.1* 5.9* 6.0*  ALBUMIN 2.9* 2.3* 2.3* 2.5*    Coagulation Profile:  Recent Labs Lab 03/13/16 1314  INR 1.05    Cardiac Enzymes: No results for input(s): CKTOTAL, CKMB, CKMBINDEX, TROPONINI in the last 168 hours.  HbA1C: Hgb A1c MFr Bld  Date/Time Value Ref Range Status  03/10/2016 04:38 AM 6.2 (H) 4.8 - 5.6 % Final    Comment:    (NOTE)         Pre-diabetes:  5.7 - 6.4         Diabetes: >6.4         Glycemic control for adults with diabetes: <7.0      Recent Results (from the past 240 hour(s))  Culture, blood (routine x 2) Call MD if unable to obtain prior to antibiotics being given     Status: None   Collection Time: 03/10/16  1:48 AM  Result Value Ref Range Status   Specimen Description BLOOD LEFT ARM  Final   Special Requests BOTTLES DRAWN AEROBIC AND ANAEROBIC 5CC EA  Final   Culture NO GROWTH 5 DAYS  Final   Report Status 03/15/2016 FINAL  Final  Urine culture     Status: None   Collection Time: 03/10/16  3:10 AM  Result Value Ref Range Status   Specimen Description URINE, CLEAN CATCH  Final   Special Requests NONE  Final   Culture NO GROWTH  Final   Report Status 03/11/2016 FINAL  Final  Respiratory Panel by PCR     Status: None   Collection Time: 03/10/16  4:38 AM  Result Value Ref Range Status   Adenovirus NOT DETECTED NOT DETECTED Final   Coronavirus 229E NOT DETECTED NOT DETECTED Final   Coronavirus HKU1 NOT DETECTED NOT DETECTED Final   Coronavirus NL63 NOT DETECTED NOT DETECTED Final   Coronavirus OC43 NOT DETECTED NOT DETECTED Final   Metapneumovirus NOT DETECTED NOT DETECTED Final   Rhinovirus / Enterovirus NOT DETECTED NOT DETECTED Final   Influenza A NOT DETECTED NOT DETECTED Final   Influenza B NOT DETECTED NOT DETECTED Final   Parainfluenza Virus 1 NOT DETECTED NOT DETECTED Final   Parainfluenza Virus 2 NOT DETECTED NOT DETECTED Final   Parainfluenza Virus 3 NOT DETECTED  NOT DETECTED Final   Parainfluenza Virus 4 NOT DETECTED NOT DETECTED Final   Respiratory Syncytial Virus NOT DETECTED NOT DETECTED Final   Bordetella pertussis NOT DETECTED NOT DETECTED Final   Chlamydophila pneumoniae NOT DETECTED NOT DETECTED Final   Mycoplasma pneumoniae NOT DETECTED NOT DETECTED Final  Culture, blood (routine x 2) Call MD if unable to obtain prior to antibiotics being given     Status: None   Collection Time: 03/10/16  4:38 AM  Result Value Ref Range Status   Specimen Description BLOOD RIGHT ANTECUBITAL  Final   Special Requests BOTTLES DRAWN AEROBIC AND ANAEROBIC 5CC EA  Final   Culture NO GROWTH 5 DAYS  Final   Report Status 03/15/2016 FINAL  Final  MRSA PCR Screening     Status: None   Collection Time: 03/13/16 10:52 PM  Result Value Ref Range Status   MRSA by PCR NEGATIVE NEGATIVE Final    Comment:        The GeneXpert MRSA Assay (FDA approved for NASAL specimens only), is one component of a comprehensive MRSA colonization surveillance program. It is not intended to diagnose MRSA infection nor to guide or monitor treatment for MRSA infections.      Scheduled Meds: . bethanechol  10 mg Oral QID  . brimonidine  1 drop Both Eyes TID  . dorzolamide-timolol  1 drop Both Eyes TID  . enoxaparin (LOVENOX) injection  30 mg Subcutaneous Q24H  . pilocarpine  1 drop Left Eye QID  . prednisoLONE acetate  1 drop Left Eye QID  . tiotropium  18 mcg Inhalation Daily     LOS: 5 days   Richarda Overlie, MD [facility] Office  [phone] Pager - Text Page per Loretha Stapler  as per below:  On-Call/Text Page:      [email or web address]      password [facility]    03/18/2016, 8:14 AM

## 2016-03-18 NOTE — Care Management Important Message (Signed)
Important Message  Patient Details  Name: Alexis Andrews MRN: [id number] Date of Birth: 04-29-1927   Medicare Important Message Given:  Yes    [name] 03/18/2016, 10:29 AM

## 2016-03-18 NOTE — Progress Notes (Signed)
Occupational Therapy Treatment Patient Details Name: Alexis Andrews MRN: [id number] DOB: [birth year] Today's Date: 03/18/2016    History of present illness 82 y.o. 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  She  is able to read screen on cell phone correctly with Rt and Lt eyes!!.  She demonstrates improving functional mobility and activity tolerance.   Follow Up Recommendations  Home health OT;Supervision/Assistance - 24 hour    Equipment Recommendations  None recommended by OT    Recommendations for Other Services      Precautions / Restrictions         Mobility Bed Mobility Overal bed mobility: Needs Assistance Bed Mobility: Supine to Sit     Supine to sit: Supervision        Transfers Overall transfer level: Needs assistance Equipment used: 1 person hand held assist Transfers: Sit to/from Stand;Stand Pivot Transfers Sit to Stand: Min assist Stand pivot transfers: Min assist            Balance Overall balance assessment: Needs assistance   Sitting balance-Leahy Scale: Good       Standing balance-Leahy Scale: Fair                     ADL Overall ADL's : Needs assistance/impaired                         Toilet Transfer: Minimal assistance;Ambulation;Comfort height toilet;Grab bars   Toileting- Clothing Manipulation and Hygiene: Minimal assistance;Sit to/from stand       Functional mobility during ADLs: Minimal assistance        Vision                 Additional Comments: with glasses on, pt able to pull up contact list on her cell phone and able to scroll through contacts to locate correct contacts.  She was able to read the screen with both her LT and her Rt eye    Perception     Praxis       Cognition   Behavior During Therapy: [facility] for tasks assessed/performed Overall Cognitive Status: Within Functional Limits for tasks assessed                       Extremity/Trunk Assessment               Exercises     Shoulder Instructions       General Comments      Pertinent Vitals/ Pain       Pain Assessment: No/denies pain  Home Living                                          Prior Functioning/Environment              Frequency Min 2X/week     Progress Toward Goals  OT Goals(current goals can now be found in the care plan section)  Progress towards OT goals: Progressing toward goals  Acute Rehab OT Goals Patient Stated Goal: return home OT Goal Formulation: With patient/family Time For Goal Achievement: 03/31/16 Potential to Achieve Goals: Good ADL Goals Pt Will Perform Grooming:  with supervision;standing Pt Will Perform Upper Body Bathing: with set-up;sitting Pt Will Perform Lower Body Bathing: with min assist;sit to/from stand Pt Will Perform Upper Body Dressing: with min assist;sitting Pt Will Perform Lower Body Dressing: with min assist;sit to/from stand Pt Will Transfer to Toilet: with supervision;ambulating;regular height toilet;grab bars Pt Will Perform Toileting - Clothing Manipulation and hygiene: with supervision;sit to/from stand Additional ADL Goal #1: Pt and daughter will verbalize understanding re: low vision community resources and equipment   Plan Discharge plan remains appropriate    Co-evaluation                 End of Session Equipment Utilized During Treatment: Oxygen   Activity Tolerance Patient limited by fatigue   Patient Left in chair;with call bell/phone within reach;with family/visitor present   Nurse Communication Mobility status        Time: 7741-2878 OT Time Calculation (min): 28 min  Charges: OT General Charges $OT Visit: 1 Procedure OT Treatments $Self Care/Home  Management : 8-22 mins $Therapeutic Activity: 23-37 mins  [name] M 03/18/2016, 4:34 PM

## 2016-03-18 NOTE — Progress Notes (Signed)
Patient voided 100 cc of urine, bladder scan noted 419 and c/o discomfort, patient voided another 50 cc,  In  and out catheterization done  And obtained 400 cc. Will continue to monitor.

## 2016-03-18 NOTE — Care Management Note (Signed)
Case Management Note  Patient Details  Name: Alexis Andrews MRN: [id number] Date of Birth: 19-Jan-1927  Subjective/Objective:                    Action/Andrews:  Spoke to patient and daughter at bedside. Andrews discharge tomorrow pending labs. Patient already has home oxygen through [facility] . Daughter will bring portable tank with her tomorrow and transport her mother home via private car.   Confirmed with Alexis Andrews with [facility], PT, OT referral is accepted. Expected Discharge Date:  03/15/16               Expected Discharge Andrews:  Home w Home Health Services  In-House Referral:     Discharge planning Services  CM Consult  Post Acute Care Choice:  Resumption of Svcs/PTA Provider Choice offered to:  Patient, Adult Children  DME Arranged:    DME Agency:     HH Arranged:  RN, PT, OT (ot for low vision) HH Agency:  [facility] (now [facility])  Status of Service:  Completed, signed off  If discussed at Long Length of Stay Meetings, dates discussed:    Additional Comments:  Alexis Plan, RN 03/18/2016, 1:52 PM

## 2016-03-18 NOTE — Progress Notes (Signed)
Patient able to void 200 cc of urine without difficulty.

## 2016-03-19 DIAGNOSIS — H40212 Acute angle-closure glaucoma, left eye: Secondary | ICD-10-CM

## 2016-03-19 DIAGNOSIS — I10 Essential (primary) hypertension: Secondary | ICD-10-CM

## 2016-03-19 DIAGNOSIS — E871 Hypo-osmolality and hyponatremia: Secondary | ICD-10-CM

## 2016-03-19 DIAGNOSIS — H4050X0 Glaucoma secondary to other eye disorders, unspecified eye, stage unspecified: Secondary | ICD-10-CM | POA: Diagnosis present

## 2016-03-19 DIAGNOSIS — H5704 Mydriasis: Secondary | ICD-10-CM

## 2016-03-19 DIAGNOSIS — G9340 Encephalopathy, unspecified: Secondary | ICD-10-CM

## 2016-03-19 DIAGNOSIS — R339 Retention of urine, unspecified: Secondary | ICD-10-CM | POA: Diagnosis present

## 2016-03-19 DIAGNOSIS — I519 Heart disease, unspecified: Secondary | ICD-10-CM

## 2016-03-19 LAB — COMPREHENSIVE METABOLIC PANEL
ALBUMIN: 2.6 g/dL — AB (ref 3.5–5.0)
ALK PHOS: 44 U/L (ref 38–126)
ALT: 12 U/L — AB (ref 14–54)
AST: 13 U/L — ABNORMAL LOW (ref 15–41)
Anion gap: 8 (ref 5–15)
BILIRUBIN TOTAL: 0.5 mg/dL (ref 0.3–1.2)
BUN: 8 mg/dL (ref 6–20)
CALCIUM: 8.7 mg/dL — AB (ref 8.9–10.3)
CO2: 29 mmol/L (ref 22–32)
CREATININE: 0.81 mg/dL (ref 0.44–1.00)
Chloride: 96 mmol/L — ABNORMAL LOW (ref 101–111)
GFR calc non Af Amer: >60 mL/min (ref >60)
GLUCOSE: 90 mg/dL (ref 65–99)
Potassium: 4.4 mmol/L (ref 3.5–5.1)
SODIUM: 133 mmol/L — AB (ref 135–145)
TOTAL PROTEIN: 6.1 g/dL — AB (ref 6.5–8.1)

## 2016-03-19 LAB — CBC
HCT: 31.7 % — ABNORMAL LOW (ref 36.0–46.0)
Hemoglobin: 9.5 g/dL — ABNORMAL LOW (ref 12.0–15.0)
MCH: 29.5 pg (ref 26.0–34.0)
MCHC: 30 g/dL (ref 30.0–36.0)
MCV: 98.4 fL (ref 78.0–100.0)
PLATELETS: 364 10*3/uL (ref 150–400)
RBC: 3.22 MIL/uL — ABNORMAL LOW (ref 3.87–5.11)
RDW: 12.7 % (ref 11.5–15.5)
WBC: 8.3 10*3/uL (ref 4.0–10.5)

## 2016-03-19 MED ORDER — PILOCARPINE HCL 2 % OP SOLN: 1.0000 [drp] | Freq: Four times a day (QID) | OPHTHALMIC | 12 refills | Status: AC

## 2016-03-19 MED ORDER — BRIMONIDINE TARTRATE 0.15 % OP SOLN: 1.0000 [drp] | Freq: Three times a day (TID) | OPHTHALMIC | 12 refills | Status: AC

## 2016-03-19 MED ORDER — PREDNISOLONE ACETATE 1 % OP SUSP: 1.0000 [drp] | Freq: Four times a day (QID) | OPHTHALMIC | 0 refills | Status: AC

## 2016-03-19 MED ORDER — SIMETHICONE 80 MG PO CHEW: 80.0000 mg | CHEWABLE_TABLET | Freq: Four times a day (QID) | ORAL | 0 refills | Status: AC | PRN

## 2016-03-19 MED ORDER — GUAIFENESIN ER 600 MG PO TB12: 1200.0000 mg | ORAL_TABLET | Freq: Two times a day (BID) | ORAL | 0 refills | Status: AC | PRN

## 2016-03-19 MED ORDER — DORZOLAMIDE HCL-TIMOLOL MAL 2-0.5 % OP SOLN: 1.0000 [drp] | Freq: Three times a day (TID) | OPHTHALMIC | 12 refills | Status: AC

## 2016-03-19 NOTE — Progress Notes (Signed)
Discharge instructions gone over with patient and patient's daughter. Home medications gone over. Prescriptions given. Follow up appointments to be made. Specific Instructions for catheter removal discussed. Diet and activity discussed. Reasons to call the doctor gone over. Patient and patient's daughter verbalized understanding of instructions.

## 2016-03-19 NOTE — Discharge Summary (Signed)
Physician Discharge Summary  Alexis Andrews GEX:[id number] DOB: Dec 17, 1926 DOA: 03/13/2016  PCP: Ginette Otto, MD  Admit date: 03/13/2016 Discharge date: 03/19/2016  Admitted From: Home Discharge disposition: Home   Recommendations for Outpatient Follow-Up:   1. Patient will be discharged home with a Foley catheter in place. Home health nurse to perform voiding trial in 2 days. Will need close urology follow-up if she has recurrent problems with urinary retention.   Discharge Diagnosis:   Principal Problem:    Acute phacomorphic glaucoma Active Problems:    Hypertension    Hyponatremia    Mydriasis    Acute encephalopathy    Eye pain    Diastolic dysfunction    Blurred vision, bilateral    Urinary retention    COPD    Rheumatoid arthritis    Anemia of chronic disease    Discharge Condition: Improved.  Diet recommendation: Low sodium, heart healthy.  Carbohydrate-modified.  Regular.    History of Present Illness:   82 y.o. female with medical history significant of chronic obstructive pulmonary disease, hypertension, rheumatoid arthritis, recently hospitalized from 03/10/2016 through 03/12/2016, treated for a COPD exacerbation and community-acquired pneumonia. She was discharged on Spiriva. During that hospitalization she was also found to be hyponatremic with a sodium of 129 felt to be secondary to SIADH. She did develop urinary retention during that hospitalization, was seen by urology and given Bethachol on day of discharge. The patient's daughter has been performing in and out catheterizations since discharge.   Hospital Course by Problem:   Principal Problem:   Acute phacomorphic glaucoma/Mydriasis/eye pain/blurred vision Patient was evaluated by ophthalmology and placed on medical treatment. Spiriva use can be associated with acute glaucoma. Spiriva was subsequently discontinued. Patient was asymptomatic at discharge. She will follow-up with  ophthalmology post discharge.  Active Problems:   COPD Spiriva was discontinued. Have instructed the patient and her daughter to use albuterol as needed for wheezing.    Hypertension Resume atenolol at discharge.    Hyponatremia Thought to be from SIADH. Sodium improved to 133 at discharge.    Acute encephalopathy Multifactorial but seems to be resolved. Suspect this was related to acute urinary retention and anti-cholinergic side effects related to Spiriva.    Diastolic dysfunction Well compensated at discharge.    Urinary retention Likely from the anti-cholinergic effects of Spiriva. This was discontinued. Patient was still catheter dependent at discharge and failed an inpatient voiding trial. Home health nursing will conduct another voiding trial in 2 days. I suspect that this will improve once the Spiriva is out of her system.     Medical Consultants:    Ophthalmology   Discharge Exam:   Vitals:   03/18/16 2120 03/19/16 0500  BP: (!) 146/64 (!) 160/55  Pulse: 71 72  Resp: 16 16  Temp: 97.4 F (36.3 C) 98.7 F (37.1 C)   Vitals:   03/18/16 0605 03/18/16 1410 03/18/16 2120 03/19/16 0500  BP: (!) 123/48 (!) 124/43 (!) 146/64 (!) 160/55  Pulse: 73 78 71 72  Resp: 15 15 16 16   Temp: 98.2 F (36.8 C) 98.8 F (37.1 C) 97.4 F (36.3 C) 98.7 F (37.1 C)  TempSrc:  Oral Oral Oral  SpO2: 97% 100% 100% 99%  Weight: 63.1 kg (139 lb 0.6 oz)     Height:        General exam: Appears calm and comfortable.  Respiratory system: Clear to auscultation. Respiratory effort normal. Cardiovascular system: S1 & S2 heard, RRR. No JVD,  rubs,  gallops or clicks. No murmurs. Gastrointestinal system: Abdomen is nondistended, soft and nontender. No organomegaly or masses felt. Normal bowel sounds heard. Central nervous system: Mildly sleepy. No focal neurological deficits. Extremities: No clubbing,  or cyanosis. No edema. Skin: No rashes, lesions or ulcers. Psychiatry: Judgement  and insight appear normal. Mood & affect appropriate.    The results of significant diagnostics from this hospitalization (including imaging, microbiology, ancillary and laboratory) are listed below for reference.     Procedures and Diagnostic Studies:   Ct Angio Head W Or Wo Contrast  Result Date: 03/13/2016 CLINICAL DATA:  82 year old female with headache, blurred vision, dilated pupil. Hypertension. Confusion. Initial encounter. EXAM: CT ANGIOGRAPHY HEAD TECHNIQUE: Multidetector CT imaging of the head was performed using the standard protocol during bolus administration of intravenous contrast. Multiplanar CT image reconstructions and MIPs were obtained to evaluate the vascular anatomy. CONTRAST:  50 mL Isovue 370 COMPARISON:  Noncontrast head CT 03/13/2016 FINDINGS: Posterior circulation: Patent distal vertebral arteries, the left is dominant and primarily supplies the basilar. The right vertebral artery is non dominant and functionally terminates in PICA. Normal left PICA origin. Mild to moderate basilar tortuosity without stenosis. Normal SCA and PCA origins. Both posterior communicating arteries are present. P1 segments are within normal limits, however there is moderate bilateral P2 segment irregularity and stenosis, greater on the left (series 10, image 19). Distal PCA enhancement is preserved. Anterior circulation: Negative distal cervical ICAs. Both ICA siphons are patent. Tortuous siphons greater on the right. Siphon calcified atherosclerosis without stenosis. Ophthalmic and posterior communicating artery origins are normal. Normal carotid termini. Dominant right and diminutive left ACA A1 segments. Anterior communicating artery and bilateral ACA branches are within normal limits. Left MCA origin, M1 segment, bifurcation, and left MCA branches are within normal limits. Right MCA origin, M1 segment, bifurcation, and right MCA branches are within normal limits. Venous sinuses: Patent. Anatomic  variants: Dominant left vertebral artery. Dominant right ACA A1 segment. Delayed phase: Stable gray-white matter differentiation throughout the brain. No acute intracranial hemorrhage identified. No midline shift, mass effect, or evidence of intracranial mass lesion. No ventriculomegaly. No abnormal enhancement identified. Chronic left sphenoid sinusitis. No acute osseous abnormality identified. Visualized orbits and scalp soft tissues are within normal limits. IMPRESSION: 1. Negative for emergent large vessel occlusion. No intracranial aneurysm. 2. No anterior circulation atherosclerosis. Mild ICA siphon dolichoectasia. 3. Moderate irregularity and stenosis of the bilateral PCA P2 segments, worse on the left. Preserved distal flow. 4.  Stable CT appearance of the brain since 1328 hours today. Electronically Signed   By: Odessa Fleming M.D.   On: 03/13/2016 15:47   Dg Chest 2 View  Result Date: 03/13/2016 CLINICAL DATA:  Shortness of breath. Poor historian. History of hypertension, rheumatoid arthritis and COPD. EXAM: CHEST  2 VIEW COMPARISON:  Chest radiograph March 10, 2016 FINDINGS: The cardiac silhouette is mildly enlarged, mediastinal silhouette is nonsuspicious, calcified aortic knob. Diffuse interstitial prominence, similar to worse. Bibasilar strandy densities. No pleural effusion. No pneumothorax. Osteopenia. Soft tissue planes are nonsuspicious. Old RIGHT rib fractures. IMPRESSION: Stable cardiomegaly. Similar to increasing interstitial prominence suspicious for atypical infection without focal consolidation. Bibasilar atelectasis. Electronically Signed   By: Awilda Metro M.D.   On: 03/13/2016 18:25   Ct Head Code Stroke Wo Contrast`  Result Date: 03/13/2016 CLINICAL DATA:  Code stroke. Abnormal pupils and blood pressure over 200. Some confusion. EXAM: CT HEAD WITHOUT CONTRAST TECHNIQUE: Contiguous axial images were obtained from the base of the skull through  the vertex without intravenous contrast.  COMPARISON:  None. FINDINGS: No evidence of large acute infarct, focal mass effect, or intracranial hemorrhage. Nonspecific lucencies in subcortical and periventricular white matter compatible with mild chronic microvascular ischemic changes. Mild parenchymal volume loss. No hyperdense vessel identified. Extensive calcific atherosclerosis of cavernous internal carotid arteries. Left greater than right sphenoid sinus opacification with small fluid levels. Paranasal sinuses are otherwise normally aerated. Mastoid air cells are normally aerated. Orbits are unremarkable. No displaced calvarial fracture. ASPECTS [facility]([place] Stroke Program Early CT Score) - Ganglionic level infarction (caudate, lentiform nuclei, internal capsule, insula, M1-M3 cortex): 7 - Supraganglionic infarction (M4-M6 cortex): 10 Total score (0-10 with 10 being normal): 10 IMPRESSION: 1. No acute intracranial abnormality is identified. If symptoms persist or if clinically indicated MRI is more sensitive for acute stroke. 2. Mild chronic microvascular ischemic changes and parenchymal volume loss. 3. Paranasal sinus disease with nonspecific fluid levels which may represent acute sinusitis. 4. ASPECTS is 10 These results were called by telephone at the time of interpretation on 03/13/2016 at 1:44 pm to Dr. Roxy Mannsster, who verbally acknowledged these results. Electronically Signed   By: Mitzi HansenLance  Furusawa-Stratton M.D.   On: 03/13/2016 13:44     Labs:   Basic Metabolic Panel:  Recent Labs Lab 03/14/16 1345  03/15/16 1505 03/16/16 0330 03/17/16 0239 03/18/16 0517 03/19/16 0319  NA 122*  < > 124* 128* 129* 132* 133*  K 3.7  < > 3.3* 3.9 4.0 3.8 4.4  CL 89*  < > 86* 91* 95* 96* 96*  CO2 22  < > 30 32 28 32 29  GLUCOSE 103*  < > 120* 100* 110* 101* 90  BUN 12  < > 22* 21* 15 12 8   CREATININE 0.80  < > 1.27* 1.12* 0.99 0.85 0.81  CALCIUM 8.0*  < > 8.2* 8.2* 8.2* 8.5* 8.7*  MG 1.8  --   --   --   --   --   --   < > = values in this interval not  displayed. GFR Estimated Creatinine Clearance: 40.6 mL/min (by C-G formula based on SCr of 0.81 mg/dL). Liver Function Tests:  Recent Labs Lab 03/13/16 1314 03/15/16 0343 03/16/16 0330 03/17/16 0239 03/19/16 0319  AST 42* 15 14* 14* 13*  ALT 35 19 15 13* 12*  ALKPHOS 51 39 39 37* 44  BILITOT 0.6 0.5 0.3 0.4 0.5  PROT 7.4 6.1* 5.9* 6.0* 6.1*  ALBUMIN 2.9* 2.3* 2.3* 2.5* 2.6*   Coagulation profile  Recent Labs Lab 03/13/16 1314  INR 1.05    CBC:  Recent Labs Lab 03/13/16 1314  03/14/16 0332 03/15/16 0343 03/16/16 0330 03/17/16 0239 03/19/16 0319  WBC 14.0*  --  13.8* 10.4 10.6* 9.8 8.3  NEUTROABS 12.5*  --   --   --   --   --   --   HGB 10.4*  < > 9.1* 9.7* 9.4* 9.3* 9.5*  HCT 32.9*  < > 28.9* 31.0* 29.6* 30.4* 31.7*  MCV 96.2  --  95.7 95.7 94.6 97.7 98.4  PLT 398  --  355 297 302 336 364  < > = values in this interval not displayed.  Microbiology Recent Results (from the past 240 hour(s))  Culture, blood (routine x 2) Call MD if unable to obtain prior to antibiotics being given     Status: None   Collection Time: 03/10/16  1:48 AM  Result Value Ref Range Status   Specimen Description BLOOD LEFT ARM  Final  Special Requests BOTTLES DRAWN AEROBIC AND ANAEROBIC 5CC EA  Final   Culture NO GROWTH 5 DAYS  Final   Report Status 03/15/2016 FINAL  Final  Urine culture     Status: None   Collection Time: 03/10/16  3:10 AM  Result Value Ref Range Status   Specimen Description URINE, CLEAN CATCH  Final   Special Requests NONE  Final   Culture NO GROWTH  Final   Report Status 03/11/2016 FINAL  Final  Respiratory Panel by PCR     Status: None   Collection Time: 03/10/16  4:38 AM  Result Value Ref Range Status   Adenovirus NOT DETECTED NOT DETECTED Final   Coronavirus 229E NOT DETECTED NOT DETECTED Final   Coronavirus HKU1 NOT DETECTED NOT DETECTED Final   Coronavirus NL63 NOT DETECTED NOT DETECTED Final   Coronavirus OC43 NOT DETECTED NOT DETECTED Final    Metapneumovirus NOT DETECTED NOT DETECTED Final   Rhinovirus / Enterovirus NOT DETECTED NOT DETECTED Final   Influenza A NOT DETECTED NOT DETECTED Final   Influenza B NOT DETECTED NOT DETECTED Final   Parainfluenza Virus 1 NOT DETECTED NOT DETECTED Final   Parainfluenza Virus 2 NOT DETECTED NOT DETECTED Final   Parainfluenza Virus 3 NOT DETECTED NOT DETECTED Final   Parainfluenza Virus 4 NOT DETECTED NOT DETECTED Final   Respiratory Syncytial Virus NOT DETECTED NOT DETECTED Final   Bordetella pertussis NOT DETECTED NOT DETECTED Final   Chlamydophila pneumoniae NOT DETECTED NOT DETECTED Final   Mycoplasma pneumoniae NOT DETECTED NOT DETECTED Final  Culture, blood (routine x 2) Call MD if unable to obtain prior to antibiotics being given     Status: None   Collection Time: 03/10/16  4:38 AM  Result Value Ref Range Status   Specimen Description BLOOD RIGHT ANTECUBITAL  Final   Special Requests BOTTLES DRAWN AEROBIC AND ANAEROBIC 5CC EA  Final   Culture NO GROWTH 5 DAYS  Final   Report Status 03/15/2016 FINAL  Final  MRSA PCR Screening     Status: None   Collection Time: 03/13/16 10:52 PM  Result Value Ref Range Status   MRSA by PCR NEGATIVE NEGATIVE Final    Comment:        The GeneXpert MRSA Assay (FDA approved for NASAL specimens only), is one component of a comprehensive MRSA colonization surveillance program. It is not intended to diagnose MRSA infection nor to guide or monitor treatment for MRSA infections.      Discharge Instructions:   Discharge Instructions    Call MD for:  extreme fatigue    Complete by:  As directed   Call MD for:  severe uncontrolled pain    Complete by:  As directed   Call MD for:  temperature >100.4    Complete by:  As directed   Diet - low sodium heart healthy    Complete by:  As directed   Face-to-face encounter (required for Medicare/Medicaid patients)    Complete by:  As directed   I RAMA,CHRISTINA certify that this patient is under my care  and that I, or a nurse practitioner or physician's assistant working with me, had a face-to-face encounter that meets the physician face-to-face encounter requirements with this patient on 03/19/2016. The encounter with the patient was in whole, or in part for the following medical condition(s) which is the primary reason for home health care (List medical condition): urinary retention with need to d/c home with foley.  Foley can be removed in 2-3 days.  Please assess post void residual.  May re-insert foley if she continues to have urinary retention.   The encounter with the patient was in whole, or in part, for the following medical condition, which is the primary reason for home health care:  urinary retention, glaucoma, FTT   I certify that, based on my findings, the following services are medically necessary home health services:   Nursing Physical therapy     Reason for Medically Necessary Home Health Services:   Skilled Nursing- Skilled Assessment/Observation Skilled Nursing- Changes in Medication/Medication Management Therapy- Therapeutic Exercises to Increase Strength and Endurance Therapy- Instruction on Safe use of Assistive Devices for ADLs Therapy- Home Adaptation to Facilitate Safety     My clinical findings support the need for the above services:  Unable to leave home safely without assistance and/or assistive device   Further, I certify that my clinical findings support that this patient is homebound due to:  Unable to leave home safely without assistance   Home Health    Complete by:  As directed   To provide the following care/treatments:   PT RN     Increase activity slowly    Complete by:  As directed       Medication List    STOP taking these medications   levofloxacin 500 MG tablet Commonly known as:  LEVAQUIN   predniSONE 10 MG tablet Commonly known as:  DELTASONE   tiotropium 18 MCG inhalation capsule Commonly known as:  SPIRIVA     TAKE these medications     albuterol (2.5 MG/3ML) 0.083% nebulizer solution Commonly known as:  PROVENTIL Take 3 mLs (2.5 mg total) by nebulization every 2 (two) hours as needed for wheezing or shortness of breath.   albuterol 108 (90 Base) MCG/ACT inhaler Commonly known as:  PROVENTIL HFA;VENTOLIN HFA Inhale 2 puffs into the lungs every 4 (four) hours as needed for wheezing or shortness of breath.   aspirin 81 MG tablet Take 81 mg by mouth daily.   atenolol 25 MG tablet Commonly known as:  TENORMIN Take 25 mg by mouth daily.   bethanechol 25 MG tablet Commonly known as:  URECHOLINE Take 1 tablet (25 mg total) by mouth 3 (three) times daily.   brimonidine 0.15 % ophthalmic solution Commonly known as:  ALPHAGAN Place 1 drop into both eyes 3 (three) times daily.   dorzolamide-timolol 22.3-6.8 MG/ML ophthalmic solution Commonly known as:  COSOPT Place 1 drop into both eyes 3 (three) times daily.   guaiFENesin 600 MG 12 hr tablet Commonly known as:  MUCINEX Take 2 tablets (1,200 mg total) by mouth 2 (two) times daily as needed. What changed:  when to take this  reasons to take this   loratadine 10 MG tablet Commonly known as:  CLARITIN Take 10 mg by mouth daily.   pilocarpine 2 % ophthalmic solution Commonly known as:  PILOCAR Place 1 drop into the left eye 4 (four) times daily.   prednisoLONE acetate 1 % ophthalmic suspension Commonly known as:  PRED FORTE Place 1 drop into the left eye 4 (four) times daily.   simethicone 80 MG chewable tablet Commonly known as:  GAS-X Chew 1 tablet (80 mg total) by mouth every 6 (six) hours as needed for flatulence.   SIMPONI Chacra Inject into the skin See admin instructions. Every 8 weeks      Follow-up Information    Ginette Otto, MD. Schedule an appointment as soon as possible for a visit in 2 day(s).  Specialty:  Internal Medicine Why:  Please call to make this appointment as soon as possible, hospital follow-up Contact information: [address] [place] [address] [phone]        Michel Harrow, MD. Schedule an appointment as soon as possible for a visit in 2 day(s).   Specialty:  Ophthalmology Why:  Call and make follow-up appointment as soon as possible for glaucoma Contact information: [address] [place] [address] [phone]            Time coordinating discharge: Greater than 35 minutes.  Signed:  RAMA,CHRISTINA  Pager [phone] [facility] 03/19/2016, 4:36 PM

## 2016-03-19 NOTE — Progress Notes (Signed)
Physical Therapy Treatment Patient Details Name: Alexis Andrews MRN: [id number] DOB: 02/13/1927 Today's Date: 03/19/2016    History of Present Illness 82 y.o. F with history of COPD, HTN, RA, and a hospitalization 03/10/2016-03/12/2016 when she was treated for a COPD exacerbation and community-acquired pneumonia presents now for Phacomorphic glaucoma with acute vision loss Lt eye    PT Comments    Pt presented sitting OOB in recliner when PT entered room. Pt was awake and willing to participate in therapy session. Pt making steady progress towards achieving her goals; however, pt continues to be limited by fatigue. Pt would continue to benefit from skilled physical therapy services at this time while admitted and after d/c to address her limitations in order to improve her overall safety and independence with functional mobility.   Follow Up Recommendations  Home health PT;Supervision for mobility/OOB     Equipment Recommendations  None recommended by PT    Recommendations for Other Services       Precautions / Restrictions Restrictions Weight Bearing Restrictions: No    Mobility  Bed Mobility               General bed mobility comments: Pt sitting OOB in recliner when PT entered room  Transfers Overall transfer level: Needs assistance Equipment used: Rolling walker (2 wheeled) Transfers: Sit to/from Stand Sit to Stand: Min guard         General transfer comment: pt required increased time and VC'ing for bilateral hand positioning  Ambulation/Gait Ambulation/Gait assistance: Min guard Ambulation Distance (Feet): 30 Feet Assistive device: Rolling walker (2 wheeled) Gait Pattern/deviations: Step-through pattern;Decreased stride length;Trunk flexed Gait velocity: decreased       Stairs            Wheelchair Mobility    Modified Rankin (Stroke Patients Only)       Balance Overall balance assessment: Needs assistance Sitting-balance support: Feet  supported;No upper extremity supported Sitting balance-Leahy Scale: Fair     Standing balance support: During functional activity;Bilateral upper extremity supported Standing balance-Leahy Scale: Poor                      Cognition Arousal/Alertness: Awake/alert Behavior During Therapy: WFL for tasks assessed/performed Overall Cognitive Status: Within Functional Limits for tasks assessed                      Exercises      General Comments        Pertinent Vitals/Pain Pain Assessment: No/denies pain    Home Living                      Prior Function            PT Goals (current goals can now be found in the care plan section) Acute Rehab PT Goals Patient Stated Goal: return home PT Goal Formulation: With patient Time For Goal Achievement: 03/24/16 Potential to Achieve Goals: Good Progress towards PT goals: Progressing toward goals    Frequency  Min 3X/week    PT Plan Current plan remains appropriate    Co-evaluation             End of Session Equipment Utilized During Treatment: Gait belt;Oxygen Activity Tolerance: Patient limited by fatigue Patient left: in chair;with call bell/phone within reach;with family/visitor present     Time: 6440-3474 PT Time Calculation (min) (ACUTE ONLY): 30 min  Charges:  $Gait Training: 8-22 mins $Therapeutic Activity: 8-22 mins  G CodesAlessandra Bevels Zyheir Daft 04/04/16, 3:19 PM Deborah Chalk, PT, DPT [phone]

## 2016-03-19 NOTE — Progress Notes (Signed)
PT Cancellation Note  Patient Details Name: Alexis Andrews MRN: [id number] DOB: [birth year]   Cancelled Treatment:    Reason Eval/Treat Not Completed: Patient declined, no reason specified. Pt sleeping in bed when PT entered room. Pt's daughter in room requesting that PT return at a later time as pt did not sleep well last night. PT will continue to f/u with pt as appropriate.   [name] 03/19/2016, 10:28 AM Deborah Chalk, PT, DPT [phone]

## 2016-03-19 NOTE — Progress Notes (Signed)
Patient urinated 200 cc last night. Urinated 150 cc. this am but c/o fullness in the bladder and discomfort. Noted lower abdominal distention. Post void residual with bladder scan showed 840 cc of urine. 14 French foley catheter inserted without dfficulty with 850 cc output.

## 2016-03-21 DIAGNOSIS — M069 Rheumatoid arthritis, unspecified: Secondary | ICD-10-CM | POA: Diagnosis not present

## 2016-03-21 DIAGNOSIS — J441 Chronic obstructive pulmonary disease with (acute) exacerbation: Secondary | ICD-10-CM | POA: Diagnosis not present

## 2016-03-21 DIAGNOSIS — R339 Retention of urine, unspecified: Secondary | ICD-10-CM | POA: Diagnosis not present

## 2016-03-21 DIAGNOSIS — I248 Other forms of acute ischemic heart disease: Secondary | ICD-10-CM | POA: Diagnosis not present

## 2016-03-21 DIAGNOSIS — I110 Hypertensive heart disease with heart failure: Secondary | ICD-10-CM | POA: Diagnosis not present

## 2016-03-21 DIAGNOSIS — I5030 Unspecified diastolic (congestive) heart failure: Secondary | ICD-10-CM | POA: Diagnosis not present

## 2016-03-22 ENCOUNTER — Emergency Department (HOSPITAL_COMMUNITY)
Admission: EM | Admit: 2016-03-22 | Discharge: 2016-03-22 | Disposition: A | Discharge status: Home / Self Care | Payer: Medicare Other | Attending: Emergency Medicine | Admitting: Emergency Medicine

## 2016-03-22 ENCOUNTER — Encounter (HOSPITAL_COMMUNITY): Payer: Self-pay | Admitting: Emergency Medicine

## 2016-03-22 DIAGNOSIS — J449 Chronic obstructive pulmonary disease, unspecified: Secondary | ICD-10-CM | POA: Diagnosis not present

## 2016-03-22 DIAGNOSIS — Z0001 Encounter for general adult medical examination with abnormal findings: Secondary | ICD-10-CM | POA: Insufficient documentation

## 2016-03-22 DIAGNOSIS — Z8709 Personal history of other diseases of the respiratory system: Secondary | ICD-10-CM

## 2016-03-22 DIAGNOSIS — R339 Retention of urine, unspecified: Secondary | ICD-10-CM | POA: Diagnosis not present

## 2016-03-22 DIAGNOSIS — R799 Abnormal finding of blood chemistry, unspecified: Secondary | ICD-10-CM | POA: Insufficient documentation

## 2016-03-22 DIAGNOSIS — Z0189 Encounter for other specified special examinations: Secondary | ICD-10-CM

## 2016-03-22 DIAGNOSIS — R0902 Hypoxemia: Secondary | ICD-10-CM | POA: Diagnosis not present

## 2016-03-22 DIAGNOSIS — I10 Essential (primary) hypertension: Secondary | ICD-10-CM | POA: Insufficient documentation

## 2016-03-22 DIAGNOSIS — Z0000 Encounter for general adult medical examination without abnormal findings: Secondary | ICD-10-CM | POA: Diagnosis not present

## 2016-03-22 DIAGNOSIS — Z87891 Personal history of nicotine dependence: Secondary | ICD-10-CM | POA: Diagnosis not present

## 2016-03-22 DIAGNOSIS — Z7982 Long term (current) use of aspirin: Secondary | ICD-10-CM | POA: Insufficient documentation

## 2016-03-22 LAB — BASIC METABOLIC PANEL
ANION GAP: 7 (ref 5–15)
BUN: 13 mg/dL (ref 6–20)
CHLORIDE: 94 mmol/L — AB (ref 101–111)
CO2: 31 mmol/L (ref 22–32)
CREATININE: 0.96 mg/dL (ref 0.44–1.00)
Calcium: 8.9 mg/dL (ref 8.9–10.3)
GFR calc non Af Amer: 52 mL/min — ABNORMAL LOW (ref >60)
GFR, EST AFRICAN AMERICAN: 60 mL/min — AB (ref >60)
Glucose, Bld: 192 mg/dL — ABNORMAL HIGH (ref 65–99)
POTASSIUM: 4.1 mmol/L (ref 3.5–5.1)
SODIUM: 132 mmol/L — AB (ref 135–145)

## 2016-03-22 NOTE — Discharge Instructions (Signed)
It was our pleasure to provide your ER care today - we hope that you feel better.  Your potassium level tonight is normal.  Follow up with your doctor this week.   Return to ER if worse, new symptoms, fevers, other concern.

## 2016-03-22 NOTE — ED Provider Notes (Signed)
[facility]-EMERGENCY DEPT Provider Note   CSN: [id number] Arrival date & time: 03/22/16  2106     History   Chief Complaint Chief Complaint  Patient presents with  . Labs Only    HPI Alexis Andrews is a 82 y.o. female.  Patient indicates had outpatient labs done, and was told potassium was high and to go to ER.  Patient indicates otherwise she feels fine.  Normal appetite, and normal po intake. Denies vomiting or diarrhea. Indicates urinating of normal frequency and amount.   Denies abd or flank pain. No fever or chills. States has no current symptoms.    The history is provided by the patient.    Past Medical History:  Diagnosis Date  . COPD (chronic obstructive pulmonary disease) (HCC)   . Depression    "treated months ago; quit taking RX" (03/11/2016)  . Heart murmur   . Hypertension    "on beta blocker for arrhythmia; not for high blood pressure" (03/11/2016)  . On home oxygen therapy    "2L; 24/7 since 02/29/2016" (03/11/2016)  . Pneumonia    "now and many years ago" (03/11/2016)  . Rheumatoid arthritis(714.0)     Patient Active Problem List   Diagnosis Date Noted  . Urinary retention 03/19/2016  . 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 Blurred vision, bilateral   . Mydriasis 03/13/2016  . Acute encephalopathy 03/13/2016  . Eye pain 03/13/2016  . Diastolic dysfunction 03/13/2016  . Mild aortic stenosis 03/11/2016  . Demand ischemia (HCC)   . Elevated troponin 03/10/2016  . Acute on chronic respiratory failure with hypoxia (HCC) 03/10/2016  . Sepsis (HCC) 03/10/2016  . Hyponatremia 03/10/2016  . RA (rheumatoid arthritis) (HCC) 03/10/2016  . Hypertension   . COPD exacerbation (HCC)   . Essential hypertension     Past Surgical History:  Procedure Laterality Date  . DILATION AND CURETTAGE OF UTERUS    . EXCISIONAL HEMORRHOIDECTOMY  1960  . HAMMER TOE SURGERY    . TONSILLECTOMY  1946    OB History    No data available       Home Medications    Prior to  Admission medications   Medication Sig Start Date End Date Taking? Authorizing Provider  albuterol (PROVENTIL HFA;VENTOLIN HFA) 108 (90 Base) MCG/ACT inhaler Inhale 2 puffs into the lungs every 4 (four) hours as needed for wheezing or shortness of breath. 03/12/16   Shanker Levora Dredge, MD  albuterol (PROVENTIL) (2.5 MG/3ML) 0.083% nebulizer solution Take 3 mLs (2.5 mg total) by nebulization every 2 (two) hours as needed for wheezing or shortness of breath. 03/12/16   Maretta Bees, MD  aspirin 81 MG tablet Take 81 mg by mouth daily.    Historical Provider, MD  atenolol (TENORMIN) 25 MG tablet Take 25 mg by mouth daily.    Historical Provider, MD  bethanechol (URECHOLINE) 25 MG tablet Take 1 tablet (25 mg total) by mouth 3 (three) times daily. 03/12/16   Shanker Levora Dredge, MD  brimonidine (ALPHAGAN) 0.15 % ophthalmic solution Place 1 drop into both eyes 3 (three) times daily. 03/19/16   Christina P Rama, MD  dorzolamide-timolol (COSOPT) 22.3-6.8 MG/ML ophthalmic solution Place 1 drop into both eyes 3 (three) times daily. 03/19/16   Maryruth Bun Rama, MD  Golimumab (SIMPONI [place]) Inject into the skin See admin instructions. Every 8 weeks    Historical Provider, MD  guaiFENesin (MUCINEX) 600 MG 12 hr tablet Take 2 tablets (1,200 mg total) by mouth 2 (two) times daily as needed.  03/19/16   Maryruth Bun Rama, MD  loratadine (CLARITIN) 10 MG tablet Take 10 mg by mouth daily.    Historical Provider, MD  pilocarpine (PILOCAR) 2 % ophthalmic solution Place 1 drop into the left eye 4 (four) times daily. 03/19/16   Maryruth Bun Rama, MD  prednisoLONE acetate (PRED FORTE) 1 % ophthalmic suspension Place 1 drop into the left eye 4 (four) times daily. 03/19/16   Maryruth Bun Rama, MD  simethicone (GAS-X) 80 MG chewable tablet Chew 1 tablet (80 mg total) by mouth every 6 (six) hours as needed for flatulence. 03/19/16   Maryruth Bun Rama, MD    Family History Family History  Problem Relation Age of Onset  . Lung cancer Brother      Social History Social History  Substance Use Topics  . Smoking status: Former Smoker    Packs/day: 0.75    Years: 26.00    Types: Cigarettes  . Smokeless tobacco: Never Used     Comment: 'quit smoking when I was 60"  . Alcohol use 2.4 oz/week    4 Glasses of wine per week     Allergies   Betadine [povidone iodine] and Keflex [cephalexin]   Review of Systems Review of Systems  Constitutional: Negative for chills and fever.  HENT: Negative for sore throat.   Eyes: Negative for redness.  Respiratory: Negative for shortness of breath.   Cardiovascular: Negative for chest pain.  Gastrointestinal: Negative for diarrhea and vomiting.  Genitourinary: Negative for flank pain.  Musculoskeletal: Negative for back pain and neck pain.  Skin: Negative for rash.  Neurological: Negative for headaches.  Hematological: Does not bruise/bleed easily.  Psychiatric/Behavioral: Negative for confusion.     Physical Exam Updated Vital Signs BP 171/63 (BP Location: Left Arm)   Pulse 71   Temp 98.4 F (36.9 C) (Oral)   Resp 25   Ht 5' (1.524 m)   Wt 63.5 kg   SpO2 97%   BMI 27.34 kg/m   Physical Exam  Constitutional: She appears well-developed and well-nourished. No distress.  HENT:  Nose: Nose normal.  Mouth/Throat: Oropharynx is clear and moist.  Eyes: Conjunctivae are normal. No scleral icterus.  Neck: Neck supple. No tracheal deviation present.  Cardiovascular: Normal rate, regular rhythm, normal heart sounds and intact distal pulses.   Pulmonary/Chest: Effort normal and breath sounds normal. No respiratory distress.  Abdominal: Normal appearance. She exhibits no distension. There is no tenderness.  Musculoskeletal: She exhibits no edema.  Neurological: She is alert.  Skin: Skin is warm and dry. No rash noted.  Psychiatric: She has a normal mood and affect.  Nursing note and vitals reviewed.    ED Treatments / Results  Labs (all labs ordered are listed, but only  abnormal results are displayed) Results for orders placed or performed during the hospital encounter of 03/22/16  Basic metabolic panel  Result Value Ref Range   Sodium 132 (L) 135 - 145 mmol/L   Potassium 4.1 3.5 - 5.1 mmol/L   Chloride 94 (L) 101 - 111 mmol/L   CO2 31 22 - 32 mmol/L   Glucose, Bld 192 (H) 65 - 99 mg/dL   BUN 13 6 - 20 mg/dL   Creatinine, Ser 6.80 0.44 - 1.00 mg/dL   Calcium 8.9 8.9 - 32.1 mg/dL   GFR calc non Af Amer 52 (L) >60 mL/min   GFR calc Af Amer 60 (L) >60 mL/min   Anion gap 7 5 - 15     EKG  EKG Interpretation  Date/Time:  Saturday March 22 2016 21:14:52 EDT Ventricular Rate:  71 PR Interval:    QRS Duration: 72 QT Interval:  395 QTC Calculation: 430 R Axis:   52 Text Interpretation:  Sinus rhythm Baseline wander Confirmed by Denton Lank  MD, Caryn Bee ([id number]) on 03/22/2016 9:17:59 PM       Radiology No results found.  Procedures Procedures (including critical care time)  Medications Ordered in ED Medications - No data to display   Initial Impression / Assessment and Plan / ED Course  I have reviewed the triage vital signs and the nursing notes.  Pertinent labs & imaging results that were available during my care of the patient were reviewed by me and considered in my medical decision making (see chart for details).  Clinical Course   Monitor. Labs.   Reviewed nursing notes and prior charts for additional history.   Potassium normal.    Patient asymptomatic, and currently appears stable for d/c.     Final Clinical Impressions(s) / ED Diagnoses   Final diagnoses:  None    New Prescriptions New Prescriptions   No medications on file     Cathren Laine, MD 03/22/16 2235

## 2016-03-22 NOTE — ED Triage Notes (Signed)
In EMS from home, had blood drawn at dr office, called her tonight stating potassium was high and she needed to come to hospital immediately. A/OX4, VSS, no EKG changes.

## 2016-03-24 DIAGNOSIS — I110 Hypertensive heart disease with heart failure: Secondary | ICD-10-CM | POA: Diagnosis not present

## 2016-03-24 DIAGNOSIS — R339 Retention of urine, unspecified: Secondary | ICD-10-CM | POA: Diagnosis not present

## 2016-03-24 DIAGNOSIS — I248 Other forms of acute ischemic heart disease: Secondary | ICD-10-CM | POA: Diagnosis not present

## 2016-03-24 DIAGNOSIS — I5030 Unspecified diastolic (congestive) heart failure: Secondary | ICD-10-CM | POA: Diagnosis not present

## 2016-03-24 DIAGNOSIS — M069 Rheumatoid arthritis, unspecified: Secondary | ICD-10-CM | POA: Diagnosis not present

## 2016-03-24 DIAGNOSIS — J441 Chronic obstructive pulmonary disease with (acute) exacerbation: Secondary | ICD-10-CM | POA: Diagnosis not present

## 2016-03-25 DIAGNOSIS — I5030 Unspecified diastolic (congestive) heart failure: Secondary | ICD-10-CM | POA: Diagnosis not present

## 2016-03-25 DIAGNOSIS — I248 Other forms of acute ischemic heart disease: Secondary | ICD-10-CM | POA: Diagnosis not present

## 2016-03-25 DIAGNOSIS — M069 Rheumatoid arthritis, unspecified: Secondary | ICD-10-CM | POA: Diagnosis not present

## 2016-03-25 DIAGNOSIS — R339 Retention of urine, unspecified: Secondary | ICD-10-CM | POA: Diagnosis not present

## 2016-03-25 DIAGNOSIS — J441 Chronic obstructive pulmonary disease with (acute) exacerbation: Secondary | ICD-10-CM | POA: Diagnosis not present

## 2016-03-25 DIAGNOSIS — I110 Hypertensive heart disease with heart failure: Secondary | ICD-10-CM | POA: Diagnosis not present

## 2016-03-26 DIAGNOSIS — Z23 Encounter for immunization: Secondary | ICD-10-CM | POA: Diagnosis not present

## 2016-03-26 DIAGNOSIS — I10 Essential (primary) hypertension: Secondary | ICD-10-CM | POA: Diagnosis not present

## 2016-03-26 DIAGNOSIS — J449 Chronic obstructive pulmonary disease, unspecified: Secondary | ICD-10-CM | POA: Diagnosis not present

## 2016-03-26 DIAGNOSIS — D539 Nutritional anemia, unspecified: Secondary | ICD-10-CM | POA: Diagnosis not present

## 2016-03-26 DIAGNOSIS — R339 Retention of urine, unspecified: Secondary | ICD-10-CM | POA: Diagnosis not present

## 2016-03-26 DIAGNOSIS — H4089 Other specified glaucoma: Secondary | ICD-10-CM | POA: Diagnosis not present

## 2016-03-26 DIAGNOSIS — E871 Hypo-osmolality and hyponatremia: Secondary | ICD-10-CM | POA: Diagnosis not present

## 2016-03-26 DIAGNOSIS — Z79899 Other long term (current) drug therapy: Secondary | ICD-10-CM | POA: Diagnosis not present

## 2016-03-28 DIAGNOSIS — H2513 Age-related nuclear cataract, bilateral: Secondary | ICD-10-CM | POA: Diagnosis not present

## 2016-03-28 DIAGNOSIS — I248 Other forms of acute ischemic heart disease: Secondary | ICD-10-CM | POA: Diagnosis not present

## 2016-03-28 DIAGNOSIS — R339 Retention of urine, unspecified: Secondary | ICD-10-CM | POA: Diagnosis not present

## 2016-03-28 DIAGNOSIS — I110 Hypertensive heart disease with heart failure: Secondary | ICD-10-CM | POA: Diagnosis not present

## 2016-03-28 DIAGNOSIS — J441 Chronic obstructive pulmonary disease with (acute) exacerbation: Secondary | ICD-10-CM | POA: Diagnosis not present

## 2016-03-28 DIAGNOSIS — H25013 Cortical age-related cataract, bilateral: Secondary | ICD-10-CM | POA: Diagnosis not present

## 2016-03-28 DIAGNOSIS — H40212 Acute angle-closure glaucoma, left eye: Secondary | ICD-10-CM | POA: Diagnosis not present

## 2016-03-28 DIAGNOSIS — M069 Rheumatoid arthritis, unspecified: Secondary | ICD-10-CM | POA: Diagnosis not present

## 2016-03-28 DIAGNOSIS — I5030 Unspecified diastolic (congestive) heart failure: Secondary | ICD-10-CM | POA: Diagnosis not present

## 2016-03-31 DIAGNOSIS — J441 Chronic obstructive pulmonary disease with (acute) exacerbation: Secondary | ICD-10-CM | POA: Diagnosis not present

## 2016-03-31 DIAGNOSIS — I248 Other forms of acute ischemic heart disease: Secondary | ICD-10-CM | POA: Diagnosis not present

## 2016-03-31 DIAGNOSIS — I110 Hypertensive heart disease with heart failure: Secondary | ICD-10-CM | POA: Diagnosis not present

## 2016-03-31 DIAGNOSIS — I5030 Unspecified diastolic (congestive) heart failure: Secondary | ICD-10-CM | POA: Diagnosis not present

## 2016-03-31 DIAGNOSIS — M069 Rheumatoid arthritis, unspecified: Secondary | ICD-10-CM | POA: Diagnosis not present

## 2016-03-31 DIAGNOSIS — R339 Retention of urine, unspecified: Secondary | ICD-10-CM | POA: Diagnosis not present

## 2016-04-01 DIAGNOSIS — I5030 Unspecified diastolic (congestive) heart failure: Secondary | ICD-10-CM | POA: Diagnosis not present

## 2016-04-01 DIAGNOSIS — J441 Chronic obstructive pulmonary disease with (acute) exacerbation: Secondary | ICD-10-CM | POA: Diagnosis not present

## 2016-04-01 DIAGNOSIS — R339 Retention of urine, unspecified: Secondary | ICD-10-CM | POA: Diagnosis not present

## 2016-04-01 DIAGNOSIS — I248 Other forms of acute ischemic heart disease: Secondary | ICD-10-CM | POA: Diagnosis not present

## 2016-04-01 DIAGNOSIS — I110 Hypertensive heart disease with heart failure: Secondary | ICD-10-CM | POA: Diagnosis not present

## 2016-04-01 DIAGNOSIS — M069 Rheumatoid arthritis, unspecified: Secondary | ICD-10-CM | POA: Diagnosis not present

## 2016-04-03 DIAGNOSIS — M069 Rheumatoid arthritis, unspecified: Secondary | ICD-10-CM | POA: Diagnosis not present

## 2016-04-03 DIAGNOSIS — R339 Retention of urine, unspecified: Secondary | ICD-10-CM | POA: Diagnosis not present

## 2016-04-03 DIAGNOSIS — I110 Hypertensive heart disease with heart failure: Secondary | ICD-10-CM | POA: Diagnosis not present

## 2016-04-03 DIAGNOSIS — I5030 Unspecified diastolic (congestive) heart failure: Secondary | ICD-10-CM | POA: Diagnosis not present

## 2016-04-03 DIAGNOSIS — J441 Chronic obstructive pulmonary disease with (acute) exacerbation: Secondary | ICD-10-CM | POA: Diagnosis not present

## 2016-04-03 DIAGNOSIS — I248 Other forms of acute ischemic heart disease: Secondary | ICD-10-CM | POA: Diagnosis not present

## 2016-04-07 DIAGNOSIS — I248 Other forms of acute ischemic heart disease: Secondary | ICD-10-CM | POA: Diagnosis not present

## 2016-04-07 DIAGNOSIS — R339 Retention of urine, unspecified: Secondary | ICD-10-CM | POA: Diagnosis not present

## 2016-04-07 DIAGNOSIS — M069 Rheumatoid arthritis, unspecified: Secondary | ICD-10-CM | POA: Diagnosis not present

## 2016-04-07 DIAGNOSIS — J441 Chronic obstructive pulmonary disease with (acute) exacerbation: Secondary | ICD-10-CM | POA: Diagnosis not present

## 2016-04-07 DIAGNOSIS — I5030 Unspecified diastolic (congestive) heart failure: Secondary | ICD-10-CM | POA: Diagnosis not present

## 2016-04-07 DIAGNOSIS — I110 Hypertensive heart disease with heart failure: Secondary | ICD-10-CM | POA: Diagnosis not present

## 2016-04-09 DIAGNOSIS — M069 Rheumatoid arthritis, unspecified: Secondary | ICD-10-CM | POA: Diagnosis not present

## 2016-04-09 DIAGNOSIS — I248 Other forms of acute ischemic heart disease: Secondary | ICD-10-CM | POA: Diagnosis not present

## 2016-04-09 DIAGNOSIS — I110 Hypertensive heart disease with heart failure: Secondary | ICD-10-CM | POA: Diagnosis not present

## 2016-04-09 DIAGNOSIS — I5030 Unspecified diastolic (congestive) heart failure: Secondary | ICD-10-CM | POA: Diagnosis not present

## 2016-04-09 DIAGNOSIS — J441 Chronic obstructive pulmonary disease with (acute) exacerbation: Secondary | ICD-10-CM | POA: Diagnosis not present

## 2016-04-09 DIAGNOSIS — R339 Retention of urine, unspecified: Secondary | ICD-10-CM | POA: Diagnosis not present

## 2016-04-11 DIAGNOSIS — I5030 Unspecified diastolic (congestive) heart failure: Secondary | ICD-10-CM | POA: Diagnosis not present

## 2016-04-11 DIAGNOSIS — J441 Chronic obstructive pulmonary disease with (acute) exacerbation: Secondary | ICD-10-CM | POA: Diagnosis not present

## 2016-04-11 DIAGNOSIS — R339 Retention of urine, unspecified: Secondary | ICD-10-CM | POA: Diagnosis not present

## 2016-04-11 DIAGNOSIS — I248 Other forms of acute ischemic heart disease: Secondary | ICD-10-CM | POA: Diagnosis not present

## 2016-04-11 DIAGNOSIS — I110 Hypertensive heart disease with heart failure: Secondary | ICD-10-CM | POA: Diagnosis not present

## 2016-04-11 DIAGNOSIS — M069 Rheumatoid arthritis, unspecified: Secondary | ICD-10-CM | POA: Diagnosis not present

## 2016-04-14 DIAGNOSIS — I5030 Unspecified diastolic (congestive) heart failure: Secondary | ICD-10-CM | POA: Diagnosis not present

## 2016-04-14 DIAGNOSIS — H40033 Anatomical narrow angle, bilateral: Secondary | ICD-10-CM | POA: Diagnosis not present

## 2016-04-14 DIAGNOSIS — I110 Hypertensive heart disease with heart failure: Secondary | ICD-10-CM | POA: Diagnosis not present

## 2016-04-14 DIAGNOSIS — R339 Retention of urine, unspecified: Secondary | ICD-10-CM | POA: Diagnosis not present

## 2016-04-14 DIAGNOSIS — I248 Other forms of acute ischemic heart disease: Secondary | ICD-10-CM | POA: Diagnosis not present

## 2016-04-14 DIAGNOSIS — M069 Rheumatoid arthritis, unspecified: Secondary | ICD-10-CM | POA: Diagnosis not present

## 2016-04-14 DIAGNOSIS — J441 Chronic obstructive pulmonary disease with (acute) exacerbation: Secondary | ICD-10-CM | POA: Diagnosis not present

## 2016-04-15 DIAGNOSIS — D649 Anemia, unspecified: Secondary | ICD-10-CM | POA: Diagnosis not present

## 2016-04-15 DIAGNOSIS — I10 Essential (primary) hypertension: Secondary | ICD-10-CM | POA: Diagnosis not present

## 2016-04-15 DIAGNOSIS — E611 Iron deficiency: Secondary | ICD-10-CM | POA: Diagnosis not present

## 2016-04-16 DIAGNOSIS — J441 Chronic obstructive pulmonary disease with (acute) exacerbation: Secondary | ICD-10-CM | POA: Diagnosis not present

## 2016-04-16 DIAGNOSIS — I110 Hypertensive heart disease with heart failure: Secondary | ICD-10-CM | POA: Diagnosis not present

## 2016-04-16 DIAGNOSIS — I248 Other forms of acute ischemic heart disease: Secondary | ICD-10-CM | POA: Diagnosis not present

## 2016-04-16 DIAGNOSIS — I5030 Unspecified diastolic (congestive) heart failure: Secondary | ICD-10-CM | POA: Diagnosis not present

## 2016-04-16 DIAGNOSIS — M069 Rheumatoid arthritis, unspecified: Secondary | ICD-10-CM | POA: Diagnosis not present

## 2016-04-16 DIAGNOSIS — R339 Retention of urine, unspecified: Secondary | ICD-10-CM | POA: Diagnosis not present

## 2016-04-18 DIAGNOSIS — N390 Urinary tract infection, site not specified: Secondary | ICD-10-CM | POA: Diagnosis not present

## 2016-04-22 DIAGNOSIS — I5030 Unspecified diastolic (congestive) heart failure: Secondary | ICD-10-CM | POA: Diagnosis not present

## 2016-04-22 DIAGNOSIS — J441 Chronic obstructive pulmonary disease with (acute) exacerbation: Secondary | ICD-10-CM | POA: Diagnosis not present

## 2016-04-22 DIAGNOSIS — I110 Hypertensive heart disease with heart failure: Secondary | ICD-10-CM | POA: Diagnosis not present

## 2016-04-22 DIAGNOSIS — M069 Rheumatoid arthritis, unspecified: Secondary | ICD-10-CM | POA: Diagnosis not present

## 2016-04-22 DIAGNOSIS — R339 Retention of urine, unspecified: Secondary | ICD-10-CM | POA: Diagnosis not present

## 2016-04-22 DIAGNOSIS — I248 Other forms of acute ischemic heart disease: Secondary | ICD-10-CM | POA: Diagnosis not present

## 2016-06-04 DIAGNOSIS — I10 Essential (primary) hypertension: Secondary | ICD-10-CM | POA: Diagnosis not present

## 2016-06-04 DIAGNOSIS — F330 Major depressive disorder, recurrent, mild: Secondary | ICD-10-CM | POA: Diagnosis not present

## 2016-06-04 DIAGNOSIS — M069 Rheumatoid arthritis, unspecified: Secondary | ICD-10-CM | POA: Diagnosis not present

## 2016-06-04 DIAGNOSIS — Z79899 Other long term (current) drug therapy: Secondary | ICD-10-CM | POA: Diagnosis not present

## 2016-06-04 DIAGNOSIS — E611 Iron deficiency: Secondary | ICD-10-CM | POA: Diagnosis not present

## 2016-06-04 DIAGNOSIS — J449 Chronic obstructive pulmonary disease, unspecified: Secondary | ICD-10-CM | POA: Diagnosis not present

## 2016-06-04 DIAGNOSIS — Z0000 Encounter for general adult medical examination without abnormal findings: Secondary | ICD-10-CM | POA: Diagnosis not present

## 2016-10-31 DIAGNOSIS — J449 Chronic obstructive pulmonary disease, unspecified: Secondary | ICD-10-CM | POA: Diagnosis not present

## 2016-10-31 DIAGNOSIS — Z79899 Other long term (current) drug therapy: Secondary | ICD-10-CM | POA: Diagnosis not present

## 2016-10-31 DIAGNOSIS — M069 Rheumatoid arthritis, unspecified: Secondary | ICD-10-CM | POA: Diagnosis not present

## 2016-10-31 DIAGNOSIS — J9611 Chronic respiratory failure with hypoxia: Secondary | ICD-10-CM | POA: Diagnosis not present

## 2016-10-31 DIAGNOSIS — I10 Essential (primary) hypertension: Secondary | ICD-10-CM | POA: Diagnosis not present

## 2017-02-17 DIAGNOSIS — I10 Essential (primary) hypertension: Secondary | ICD-10-CM | POA: Diagnosis not present

## 2017-02-17 DIAGNOSIS — I350 Nonrheumatic aortic (valve) stenosis: Secondary | ICD-10-CM | POA: Diagnosis not present

## 2017-02-17 DIAGNOSIS — E611 Iron deficiency: Secondary | ICD-10-CM | POA: Diagnosis not present

## 2017-02-17 DIAGNOSIS — M069 Rheumatoid arthritis, unspecified: Secondary | ICD-10-CM | POA: Diagnosis not present

## 2017-02-17 DIAGNOSIS — Z79899 Other long term (current) drug therapy: Secondary | ICD-10-CM | POA: Diagnosis not present

## 2017-02-17 DIAGNOSIS — J449 Chronic obstructive pulmonary disease, unspecified: Secondary | ICD-10-CM | POA: Diagnosis not present

## 2017-02-17 DIAGNOSIS — J9611 Chronic respiratory failure with hypoxia: Secondary | ICD-10-CM | POA: Diagnosis not present

## 2017-05-18 DIAGNOSIS — Z0000 Encounter for general adult medical examination without abnormal findings: Secondary | ICD-10-CM | POA: Diagnosis not present

## 2017-05-18 DIAGNOSIS — Z1389 Encounter for screening for other disorder: Secondary | ICD-10-CM | POA: Diagnosis not present

## 2017-05-18 DIAGNOSIS — E611 Iron deficiency: Secondary | ICD-10-CM | POA: Diagnosis not present

## 2017-05-18 DIAGNOSIS — Z23 Encounter for immunization: Secondary | ICD-10-CM | POA: Diagnosis not present

## 2017-05-18 DIAGNOSIS — J449 Chronic obstructive pulmonary disease, unspecified: Secondary | ICD-10-CM | POA: Diagnosis not present

## 2017-05-18 DIAGNOSIS — F330 Major depressive disorder, recurrent, mild: Secondary | ICD-10-CM | POA: Diagnosis not present

## 2017-05-18 DIAGNOSIS — M069 Rheumatoid arthritis, unspecified: Secondary | ICD-10-CM | POA: Diagnosis not present

## 2017-05-18 DIAGNOSIS — J9611 Chronic respiratory failure with hypoxia: Secondary | ICD-10-CM | POA: Diagnosis not present

## 2017-05-18 DIAGNOSIS — I10 Essential (primary) hypertension: Secondary | ICD-10-CM | POA: Diagnosis not present

## 2017-09-14 DIAGNOSIS — J9611 Chronic respiratory failure with hypoxia: Secondary | ICD-10-CM | POA: Diagnosis not present

## 2017-09-14 DIAGNOSIS — R413 Other amnesia: Secondary | ICD-10-CM | POA: Diagnosis not present

## 2017-09-14 DIAGNOSIS — Z79899 Other long term (current) drug therapy: Secondary | ICD-10-CM | POA: Diagnosis not present

## 2017-09-14 DIAGNOSIS — D649 Anemia, unspecified: Secondary | ICD-10-CM | POA: Diagnosis not present

## 2017-09-14 DIAGNOSIS — M069 Rheumatoid arthritis, unspecified: Secondary | ICD-10-CM | POA: Diagnosis not present

## 2017-09-14 DIAGNOSIS — I10 Essential (primary) hypertension: Secondary | ICD-10-CM | POA: Diagnosis not present

## 2017-09-14 DIAGNOSIS — J449 Chronic obstructive pulmonary disease, unspecified: Secondary | ICD-10-CM | POA: Diagnosis not present

## 2017-10-21 ENCOUNTER — Other Ambulatory Visit: Payer: Self-pay | Admitting: Geriatric Medicine

## 2017-10-21 ENCOUNTER — Ambulatory Visit
Admission: RE | Admit: 2017-10-21 | Discharge: 2017-10-21 | Disposition: A | Discharge status: Home / Self Care | Payer: Medicare Other | Source: Ambulatory Visit | Attending: Geriatric Medicine | Admitting: Geriatric Medicine

## 2017-10-21 DIAGNOSIS — J449 Chronic obstructive pulmonary disease, unspecified: Secondary | ICD-10-CM | POA: Diagnosis not present

## 2017-10-21 DIAGNOSIS — I10 Essential (primary) hypertension: Secondary | ICD-10-CM | POA: Diagnosis not present

## 2017-10-21 DIAGNOSIS — I350 Nonrheumatic aortic (valve) stenosis: Secondary | ICD-10-CM | POA: Diagnosis not present

## 2017-10-21 DIAGNOSIS — M069 Rheumatoid arthritis, unspecified: Secondary | ICD-10-CM | POA: Diagnosis not present

## 2017-10-21 DIAGNOSIS — I498 Other specified cardiac arrhythmias: Secondary | ICD-10-CM | POA: Diagnosis not present

## 2017-10-21 DIAGNOSIS — J9611 Chronic respiratory failure with hypoxia: Secondary | ICD-10-CM | POA: Diagnosis not present

## 2018-05-24 DIAGNOSIS — F330 Major depressive disorder, recurrent, mild: Secondary | ICD-10-CM | POA: Diagnosis not present

## 2018-05-24 DIAGNOSIS — Z79899 Other long term (current) drug therapy: Secondary | ICD-10-CM | POA: Diagnosis not present

## 2018-05-24 DIAGNOSIS — J9611 Chronic respiratory failure with hypoxia: Secondary | ICD-10-CM | POA: Diagnosis not present

## 2018-05-24 DIAGNOSIS — J449 Chronic obstructive pulmonary disease, unspecified: Secondary | ICD-10-CM | POA: Diagnosis not present

## 2018-05-24 DIAGNOSIS — Z1389 Encounter for screening for other disorder: Secondary | ICD-10-CM | POA: Diagnosis not present

## 2018-05-24 DIAGNOSIS — I10 Essential (primary) hypertension: Secondary | ICD-10-CM | POA: Diagnosis not present

## 2018-05-24 DIAGNOSIS — M069 Rheumatoid arthritis, unspecified: Secondary | ICD-10-CM | POA: Diagnosis not present

## 2018-05-24 DIAGNOSIS — F0280 Dementia in other diseases classified elsewhere without behavioral disturbance: Secondary | ICD-10-CM | POA: Diagnosis not present

## 2018-05-24 DIAGNOSIS — I350 Nonrheumatic aortic (valve) stenosis: Secondary | ICD-10-CM | POA: Diagnosis not present

## 2018-05-24 DIAGNOSIS — Z23 Encounter for immunization: Secondary | ICD-10-CM | POA: Diagnosis not present

## 2018-05-24 DIAGNOSIS — Z0000 Encounter for general adult medical examination without abnormal findings: Secondary | ICD-10-CM | POA: Diagnosis not present

## 2018-05-24 DIAGNOSIS — G301 Alzheimer's disease with late onset: Secondary | ICD-10-CM | POA: Diagnosis not present

## 2018-07-20 IMAGING — CT CT ANGIO HEAD
2 of 8 series · 8 of 33 positions shown · IV contrast (OMNI 350)
Comparison: Noncontrast head CT 03/13/2016

CLINICAL DATA: 89-year-old female with headache, blurred vision,
dilated pupil. Hypertension. Confusion. Initial encounter.

EXAM:
CT ANGIOGRAPHY HEAD
TECHNIQUE: Multidetector CT imaging of the head was performed using the
standard protocol during bolus administration of intravenous
contrast. Multiplanar CT image reconstructions and MIPs were
obtained to evaluate the vascular anatomy.
CONTRAST:  50 mL Isovue 370

[Series 5: cta neck · axial · 0.42mm/px · z∈[-80,-4]mm · 3 of 77 slices shown]
[im 20/77  soft-tissue]
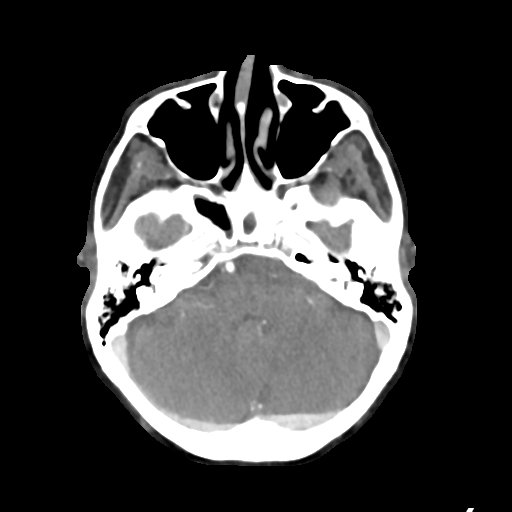
[im 39/77  soft-tissue]
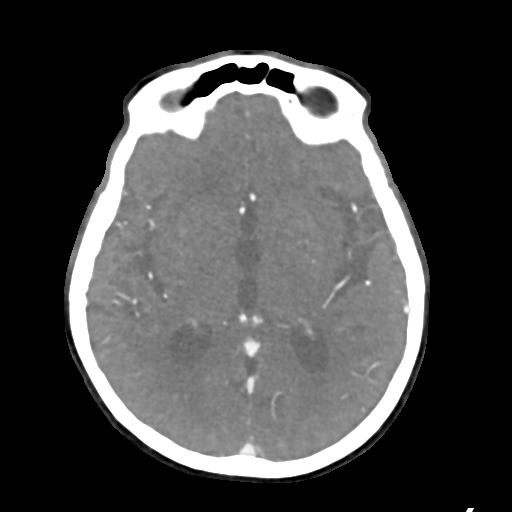
[im 58/77  soft-tissue]
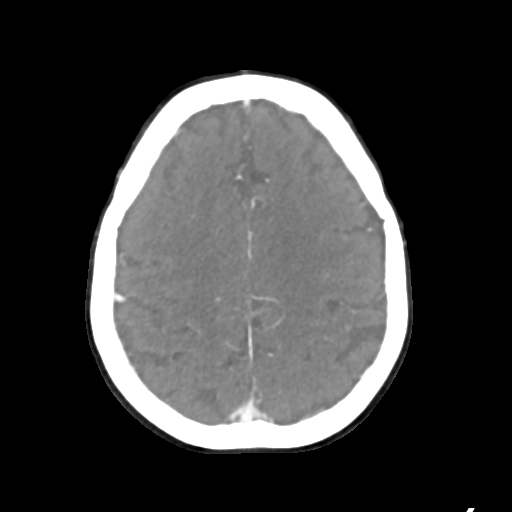

[Series 7: cta neck axial · axial · 0.34mm/px · z∈[-92,+8]mm · 5 of 152 slices shown]
[im 26/152  soft-tissue]
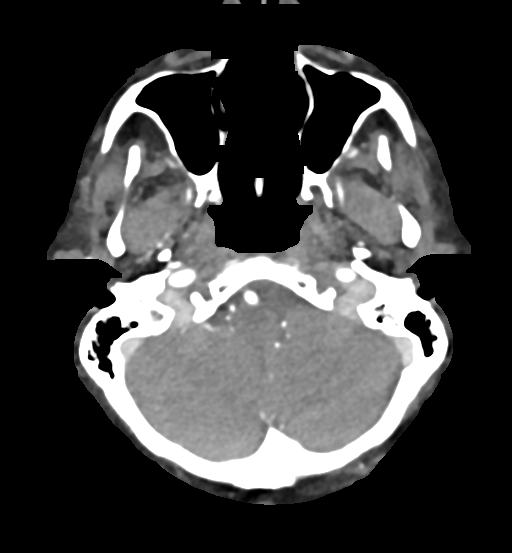
[im 51/152  bone]
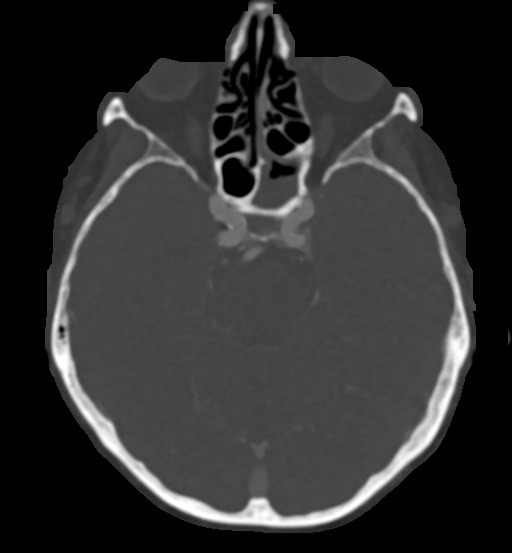
[im 76/152  soft-tissue]
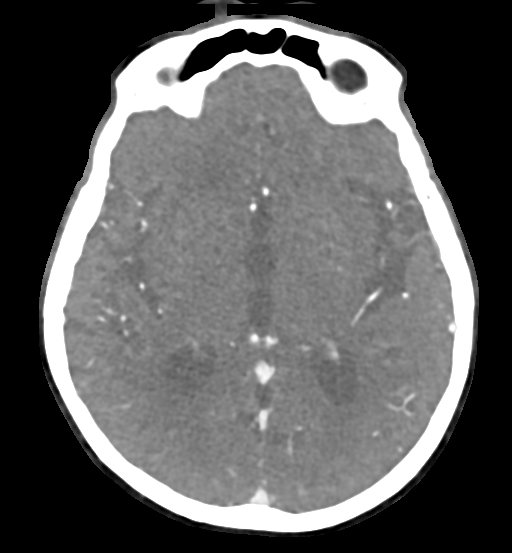
[im 101/152  bone]
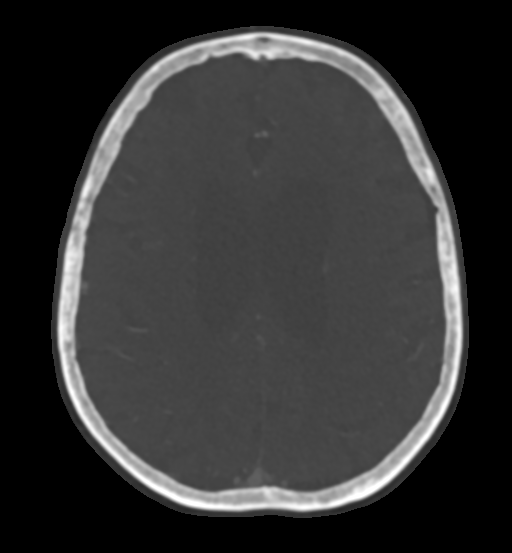
[im 126/152  soft-tissue]
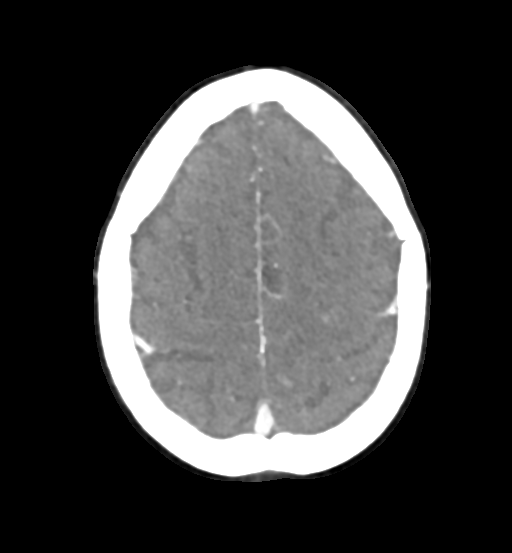

[8 of 33 positions shown; findings below may reference images not displayed]

FINDINGS: Posterior circulation: Patent distal vertebral arteries, the left is
dominant and primarily supplies the basilar. The right vertebral
artery is non dominant and functionally terminates in PICA. Normal
left PICA origin.

Mild to moderate basilar tortuosity without stenosis. Normal SCA and
PCA origins. Both posterior communicating arteries are present. P1
segments are within normal limits, however there is moderate
bilateral P2 segment irregularity and stenosis, greater on the left
(series 10, image 19). Distal PCA enhancement is preserved.

Anterior circulation: Negative distal cervical ICAs. Both ICA
siphons are patent. Tortuous siphons greater on the right. Siphon
calcified atherosclerosis without stenosis. Ophthalmic and posterior
communicating artery origins are normal. Normal carotid termini.
Dominant right and diminutive left ACA A1 segments. Anterior
communicating artery and bilateral ACA branches are within normal
limits. Left MCA origin, M1 segment, bifurcation, and left MCA
branches are within normal limits. Right MCA origin, M1 segment,
bifurcation, and right MCA branches are within normal limits.

Venous sinuses: Patent.

Anatomic variants: Dominant left vertebral artery. Dominant right
ACA A1 segment.

Delayed phase: Stable gray-white matter differentiation throughout
the brain. No acute intracranial hemorrhage identified. No midline
shift, mass effect, or evidence of intracranial mass lesion. No
ventriculomegaly. No abnormal enhancement identified.

Chronic left sphenoid sinusitis. No acute osseous abnormality
identified. Visualized orbits and scalp soft tissues are within
normal limits.
IMPRESSION: 1. Negative for emergent large vessel occlusion. No intracranial
aneurysm.
2. No anterior circulation atherosclerosis. Mild ICA siphon
dolichoectasia.
3. Moderate irregularity and stenosis of the bilateral PCA P2
segments, worse on the left. Preserved distal flow.
4.  Stable CT appearance of the brain since 2631 hours today.

## 2018-11-22 DIAGNOSIS — J449 Chronic obstructive pulmonary disease, unspecified: Secondary | ICD-10-CM | POA: Diagnosis not present

## 2018-11-22 DIAGNOSIS — J9611 Chronic respiratory failure with hypoxia: Secondary | ICD-10-CM | POA: Diagnosis not present

## 2018-11-22 DIAGNOSIS — G301 Alzheimer's disease with late onset: Secondary | ICD-10-CM | POA: Diagnosis not present

## 2018-11-22 DIAGNOSIS — I10 Essential (primary) hypertension: Secondary | ICD-10-CM | POA: Diagnosis not present

## 2018-11-22 DIAGNOSIS — F0280 Dementia in other diseases classified elsewhere without behavioral disturbance: Secondary | ICD-10-CM | POA: Diagnosis not present

## 2018-11-22 DIAGNOSIS — M069 Rheumatoid arthritis, unspecified: Secondary | ICD-10-CM | POA: Diagnosis not present

## 2019-01-09 DIAGNOSIS — J449 Chronic obstructive pulmonary disease, unspecified: Secondary | ICD-10-CM | POA: Diagnosis not present

## 2019-01-09 DIAGNOSIS — R0902 Hypoxemia: Secondary | ICD-10-CM | POA: Diagnosis not present

## 2019-01-26 DIAGNOSIS — J449 Chronic obstructive pulmonary disease, unspecified: Secondary | ICD-10-CM | POA: Diagnosis not present

## 2019-01-26 DIAGNOSIS — J9611 Chronic respiratory failure with hypoxia: Secondary | ICD-10-CM | POA: Diagnosis not present

## 2019-01-26 DIAGNOSIS — I10 Essential (primary) hypertension: Secondary | ICD-10-CM | POA: Diagnosis not present

## 2019-01-26 DIAGNOSIS — G301 Alzheimer's disease with late onset: Secondary | ICD-10-CM | POA: Diagnosis not present

## 2019-05-07 DIAGNOSIS — Z23 Encounter for immunization: Secondary | ICD-10-CM | POA: Diagnosis not present

## 2019-05-30 DIAGNOSIS — J9611 Chronic respiratory failure with hypoxia: Secondary | ICD-10-CM | POA: Diagnosis not present

## 2019-05-30 DIAGNOSIS — Z0000 Encounter for general adult medical examination without abnormal findings: Secondary | ICD-10-CM | POA: Diagnosis not present

## 2019-05-30 DIAGNOSIS — Z1389 Encounter for screening for other disorder: Secondary | ICD-10-CM | POA: Diagnosis not present

## 2019-05-30 DIAGNOSIS — I350 Nonrheumatic aortic (valve) stenosis: Secondary | ICD-10-CM | POA: Diagnosis not present

## 2019-05-30 DIAGNOSIS — G301 Alzheimer's disease with late onset: Secondary | ICD-10-CM | POA: Diagnosis not present

## 2019-05-30 DIAGNOSIS — M069 Rheumatoid arthritis, unspecified: Secondary | ICD-10-CM | POA: Diagnosis not present

## 2019-05-30 DIAGNOSIS — I10 Essential (primary) hypertension: Secondary | ICD-10-CM | POA: Diagnosis not present

## 2019-05-30 DIAGNOSIS — J449 Chronic obstructive pulmonary disease, unspecified: Secondary | ICD-10-CM | POA: Diagnosis not present

## 2019-09-27 DIAGNOSIS — Z23 Encounter for immunization: Secondary | ICD-10-CM | POA: Diagnosis not present

## 2019-10-07 DIAGNOSIS — G301 Alzheimer's disease with late onset: Secondary | ICD-10-CM | POA: Diagnosis not present

## 2019-10-07 DIAGNOSIS — F0280 Dementia in other diseases classified elsewhere without behavioral disturbance: Secondary | ICD-10-CM | POA: Diagnosis not present

## 2019-10-11 DIAGNOSIS — K922 Gastrointestinal hemorrhage, unspecified: Secondary | ICD-10-CM | POA: Diagnosis not present

## 2019-10-11 DIAGNOSIS — M069 Rheumatoid arthritis, unspecified: Secondary | ICD-10-CM | POA: Diagnosis not present

## 2019-10-11 DIAGNOSIS — J302 Other seasonal allergic rhinitis: Secondary | ICD-10-CM | POA: Diagnosis not present

## 2019-10-11 DIAGNOSIS — I4891 Unspecified atrial fibrillation: Secondary | ICD-10-CM | POA: Diagnosis not present

## 2019-10-11 DIAGNOSIS — I700 Atherosclerosis of aorta: Secondary | ICD-10-CM | POA: Diagnosis not present

## 2019-10-11 DIAGNOSIS — F0281 Dementia in other diseases classified elsewhere with behavioral disturbance: Secondary | ICD-10-CM | POA: Diagnosis not present

## 2019-10-11 DIAGNOSIS — Z6821 Body mass index (BMI) 21.0-21.9, adult: Secondary | ICD-10-CM | POA: Diagnosis not present

## 2019-10-11 DIAGNOSIS — K219 Gastro-esophageal reflux disease without esophagitis: Secondary | ICD-10-CM | POA: Diagnosis not present

## 2019-10-11 DIAGNOSIS — G309 Alzheimer's disease, unspecified: Secondary | ICD-10-CM | POA: Diagnosis not present

## 2019-10-11 DIAGNOSIS — R634 Abnormal weight loss: Secondary | ICD-10-CM | POA: Diagnosis not present

## 2019-10-11 DIAGNOSIS — Z87891 Personal history of nicotine dependence: Secondary | ICD-10-CM | POA: Diagnosis not present

## 2019-10-11 DIAGNOSIS — J449 Chronic obstructive pulmonary disease, unspecified: Secondary | ICD-10-CM | POA: Diagnosis not present

## 2019-10-11 DIAGNOSIS — I10 Essential (primary) hypertension: Secondary | ICD-10-CM | POA: Diagnosis not present

## 2019-10-12 DIAGNOSIS — F0281 Dementia in other diseases classified elsewhere with behavioral disturbance: Secondary | ICD-10-CM | POA: Diagnosis not present

## 2019-10-12 DIAGNOSIS — J449 Chronic obstructive pulmonary disease, unspecified: Secondary | ICD-10-CM | POA: Diagnosis not present

## 2019-10-12 DIAGNOSIS — M069 Rheumatoid arthritis, unspecified: Secondary | ICD-10-CM | POA: Diagnosis not present

## 2019-10-12 DIAGNOSIS — I700 Atherosclerosis of aorta: Secondary | ICD-10-CM | POA: Diagnosis not present

## 2019-10-12 DIAGNOSIS — G309 Alzheimer's disease, unspecified: Secondary | ICD-10-CM | POA: Diagnosis not present

## 2019-10-12 DIAGNOSIS — R634 Abnormal weight loss: Secondary | ICD-10-CM | POA: Diagnosis not present

## 2019-10-13 DIAGNOSIS — I4891 Unspecified atrial fibrillation: Secondary | ICD-10-CM | POA: Diagnosis not present

## 2019-10-13 DIAGNOSIS — M069 Rheumatoid arthritis, unspecified: Secondary | ICD-10-CM | POA: Diagnosis not present

## 2019-10-13 DIAGNOSIS — R634 Abnormal weight loss: Secondary | ICD-10-CM | POA: Diagnosis not present

## 2019-10-13 DIAGNOSIS — K922 Gastrointestinal hemorrhage, unspecified: Secondary | ICD-10-CM | POA: Diagnosis not present

## 2019-10-13 DIAGNOSIS — Z87891 Personal history of nicotine dependence: Secondary | ICD-10-CM | POA: Diagnosis not present

## 2019-10-13 DIAGNOSIS — J449 Chronic obstructive pulmonary disease, unspecified: Secondary | ICD-10-CM | POA: Diagnosis not present

## 2019-10-13 DIAGNOSIS — K219 Gastro-esophageal reflux disease without esophagitis: Secondary | ICD-10-CM | POA: Diagnosis not present

## 2019-10-13 DIAGNOSIS — I700 Atherosclerosis of aorta: Secondary | ICD-10-CM | POA: Diagnosis not present

## 2019-10-13 DIAGNOSIS — F0281 Dementia in other diseases classified elsewhere with behavioral disturbance: Secondary | ICD-10-CM | POA: Diagnosis not present

## 2019-10-13 DIAGNOSIS — Z6821 Body mass index (BMI) 21.0-21.9, adult: Secondary | ICD-10-CM | POA: Diagnosis not present

## 2019-10-13 DIAGNOSIS — G309 Alzheimer's disease, unspecified: Secondary | ICD-10-CM | POA: Diagnosis not present

## 2019-10-13 DIAGNOSIS — I10 Essential (primary) hypertension: Secondary | ICD-10-CM | POA: Diagnosis not present

## 2019-10-13 DIAGNOSIS — J302 Other seasonal allergic rhinitis: Secondary | ICD-10-CM | POA: Diagnosis not present

## 2019-10-18 DIAGNOSIS — G309 Alzheimer's disease, unspecified: Secondary | ICD-10-CM | POA: Diagnosis not present

## 2019-10-18 DIAGNOSIS — J449 Chronic obstructive pulmonary disease, unspecified: Secondary | ICD-10-CM | POA: Diagnosis not present

## 2019-10-18 DIAGNOSIS — M069 Rheumatoid arthritis, unspecified: Secondary | ICD-10-CM | POA: Diagnosis not present

## 2019-10-18 DIAGNOSIS — F0281 Dementia in other diseases classified elsewhere with behavioral disturbance: Secondary | ICD-10-CM | POA: Diagnosis not present

## 2019-10-18 DIAGNOSIS — I700 Atherosclerosis of aorta: Secondary | ICD-10-CM | POA: Diagnosis not present

## 2019-10-18 DIAGNOSIS — R634 Abnormal weight loss: Secondary | ICD-10-CM | POA: Diagnosis not present

## 2019-10-25 DIAGNOSIS — J449 Chronic obstructive pulmonary disease, unspecified: Secondary | ICD-10-CM | POA: Diagnosis not present

## 2019-10-25 DIAGNOSIS — G309 Alzheimer's disease, unspecified: Secondary | ICD-10-CM | POA: Diagnosis not present

## 2019-10-25 DIAGNOSIS — F0281 Dementia in other diseases classified elsewhere with behavioral disturbance: Secondary | ICD-10-CM | POA: Diagnosis not present

## 2019-10-25 DIAGNOSIS — I700 Atherosclerosis of aorta: Secondary | ICD-10-CM | POA: Diagnosis not present

## 2019-10-25 DIAGNOSIS — M069 Rheumatoid arthritis, unspecified: Secondary | ICD-10-CM | POA: Diagnosis not present

## 2019-10-25 DIAGNOSIS — R634 Abnormal weight loss: Secondary | ICD-10-CM | POA: Diagnosis not present

## 2019-10-25 DIAGNOSIS — Z23 Encounter for immunization: Secondary | ICD-10-CM | POA: Diagnosis not present

## 2019-11-02 DIAGNOSIS — G309 Alzheimer's disease, unspecified: Secondary | ICD-10-CM | POA: Diagnosis not present

## 2019-11-02 DIAGNOSIS — F0281 Dementia in other diseases classified elsewhere with behavioral disturbance: Secondary | ICD-10-CM | POA: Diagnosis not present

## 2019-11-02 DIAGNOSIS — J449 Chronic obstructive pulmonary disease, unspecified: Secondary | ICD-10-CM | POA: Diagnosis not present

## 2019-11-02 DIAGNOSIS — R634 Abnormal weight loss: Secondary | ICD-10-CM | POA: Diagnosis not present

## 2019-11-02 DIAGNOSIS — I700 Atherosclerosis of aorta: Secondary | ICD-10-CM | POA: Diagnosis not present

## 2019-11-02 DIAGNOSIS — M069 Rheumatoid arthritis, unspecified: Secondary | ICD-10-CM | POA: Diagnosis not present

## 2019-11-07 DIAGNOSIS — I700 Atherosclerosis of aorta: Secondary | ICD-10-CM | POA: Diagnosis not present

## 2019-11-07 DIAGNOSIS — M069 Rheumatoid arthritis, unspecified: Secondary | ICD-10-CM | POA: Diagnosis not present

## 2019-11-07 DIAGNOSIS — G309 Alzheimer's disease, unspecified: Secondary | ICD-10-CM | POA: Diagnosis not present

## 2019-11-07 DIAGNOSIS — R634 Abnormal weight loss: Secondary | ICD-10-CM | POA: Diagnosis not present

## 2019-11-07 DIAGNOSIS — F0281 Dementia in other diseases classified elsewhere with behavioral disturbance: Secondary | ICD-10-CM | POA: Diagnosis not present

## 2019-11-07 DIAGNOSIS — J449 Chronic obstructive pulmonary disease, unspecified: Secondary | ICD-10-CM | POA: Diagnosis not present

## 2019-11-12 DIAGNOSIS — F0281 Dementia in other diseases classified elsewhere with behavioral disturbance: Secondary | ICD-10-CM | POA: Diagnosis not present

## 2019-11-12 DIAGNOSIS — I4891 Unspecified atrial fibrillation: Secondary | ICD-10-CM | POA: Diagnosis not present

## 2019-11-12 DIAGNOSIS — J449 Chronic obstructive pulmonary disease, unspecified: Secondary | ICD-10-CM | POA: Diagnosis not present

## 2019-11-12 DIAGNOSIS — I10 Essential (primary) hypertension: Secondary | ICD-10-CM | POA: Diagnosis not present

## 2019-11-12 DIAGNOSIS — J302 Other seasonal allergic rhinitis: Secondary | ICD-10-CM | POA: Diagnosis not present

## 2019-11-12 DIAGNOSIS — Z87891 Personal history of nicotine dependence: Secondary | ICD-10-CM | POA: Diagnosis not present

## 2019-11-12 DIAGNOSIS — Z6821 Body mass index (BMI) 21.0-21.9, adult: Secondary | ICD-10-CM | POA: Diagnosis not present

## 2019-11-12 DIAGNOSIS — G309 Alzheimer's disease, unspecified: Secondary | ICD-10-CM | POA: Diagnosis not present

## 2019-11-12 DIAGNOSIS — K922 Gastrointestinal hemorrhage, unspecified: Secondary | ICD-10-CM | POA: Diagnosis not present

## 2019-11-12 DIAGNOSIS — I700 Atherosclerosis of aorta: Secondary | ICD-10-CM | POA: Diagnosis not present

## 2019-11-12 DIAGNOSIS — K219 Gastro-esophageal reflux disease without esophagitis: Secondary | ICD-10-CM | POA: Diagnosis not present

## 2019-11-12 DIAGNOSIS — M069 Rheumatoid arthritis, unspecified: Secondary | ICD-10-CM | POA: Diagnosis not present

## 2019-11-12 DIAGNOSIS — R634 Abnormal weight loss: Secondary | ICD-10-CM | POA: Diagnosis not present

## 2019-11-15 DIAGNOSIS — G309 Alzheimer's disease, unspecified: Secondary | ICD-10-CM | POA: Diagnosis not present

## 2019-11-15 DIAGNOSIS — M069 Rheumatoid arthritis, unspecified: Secondary | ICD-10-CM | POA: Diagnosis not present

## 2019-11-15 DIAGNOSIS — I700 Atherosclerosis of aorta: Secondary | ICD-10-CM | POA: Diagnosis not present

## 2019-11-15 DIAGNOSIS — F0281 Dementia in other diseases classified elsewhere with behavioral disturbance: Secondary | ICD-10-CM | POA: Diagnosis not present

## 2019-11-15 DIAGNOSIS — R634 Abnormal weight loss: Secondary | ICD-10-CM | POA: Diagnosis not present

## 2019-11-15 DIAGNOSIS — J449 Chronic obstructive pulmonary disease, unspecified: Secondary | ICD-10-CM | POA: Diagnosis not present

## 2019-11-22 DIAGNOSIS — J449 Chronic obstructive pulmonary disease, unspecified: Secondary | ICD-10-CM | POA: Diagnosis not present

## 2019-11-22 DIAGNOSIS — M069 Rheumatoid arthritis, unspecified: Secondary | ICD-10-CM | POA: Diagnosis not present

## 2019-11-22 DIAGNOSIS — F0281 Dementia in other diseases classified elsewhere with behavioral disturbance: Secondary | ICD-10-CM | POA: Diagnosis not present

## 2019-11-22 DIAGNOSIS — R634 Abnormal weight loss: Secondary | ICD-10-CM | POA: Diagnosis not present

## 2019-11-22 DIAGNOSIS — G309 Alzheimer's disease, unspecified: Secondary | ICD-10-CM | POA: Diagnosis not present

## 2019-11-22 DIAGNOSIS — I700 Atherosclerosis of aorta: Secondary | ICD-10-CM | POA: Diagnosis not present

## 2019-11-25 DIAGNOSIS — I700 Atherosclerosis of aorta: Secondary | ICD-10-CM | POA: Diagnosis not present

## 2019-11-25 DIAGNOSIS — J449 Chronic obstructive pulmonary disease, unspecified: Secondary | ICD-10-CM | POA: Diagnosis not present

## 2019-11-25 DIAGNOSIS — R634 Abnormal weight loss: Secondary | ICD-10-CM | POA: Diagnosis not present

## 2019-11-25 DIAGNOSIS — F0281 Dementia in other diseases classified elsewhere with behavioral disturbance: Secondary | ICD-10-CM | POA: Diagnosis not present

## 2019-11-25 DIAGNOSIS — G309 Alzheimer's disease, unspecified: Secondary | ICD-10-CM | POA: Diagnosis not present

## 2019-11-25 DIAGNOSIS — M069 Rheumatoid arthritis, unspecified: Secondary | ICD-10-CM | POA: Diagnosis not present

## 2019-11-29 DIAGNOSIS — I700 Atherosclerosis of aorta: Secondary | ICD-10-CM | POA: Diagnosis not present

## 2019-11-29 DIAGNOSIS — G309 Alzheimer's disease, unspecified: Secondary | ICD-10-CM | POA: Diagnosis not present

## 2019-11-29 DIAGNOSIS — J449 Chronic obstructive pulmonary disease, unspecified: Secondary | ICD-10-CM | POA: Diagnosis not present

## 2019-11-29 DIAGNOSIS — M069 Rheumatoid arthritis, unspecified: Secondary | ICD-10-CM | POA: Diagnosis not present

## 2019-11-29 DIAGNOSIS — F0281 Dementia in other diseases classified elsewhere with behavioral disturbance: Secondary | ICD-10-CM | POA: Diagnosis not present

## 2019-11-29 DIAGNOSIS — R634 Abnormal weight loss: Secondary | ICD-10-CM | POA: Diagnosis not present

## 2019-12-05 DIAGNOSIS — I700 Atherosclerosis of aorta: Secondary | ICD-10-CM | POA: Diagnosis not present

## 2019-12-05 DIAGNOSIS — F0281 Dementia in other diseases classified elsewhere with behavioral disturbance: Secondary | ICD-10-CM | POA: Diagnosis not present

## 2019-12-05 DIAGNOSIS — J449 Chronic obstructive pulmonary disease, unspecified: Secondary | ICD-10-CM | POA: Diagnosis not present

## 2019-12-05 DIAGNOSIS — G309 Alzheimer's disease, unspecified: Secondary | ICD-10-CM | POA: Diagnosis not present

## 2019-12-05 DIAGNOSIS — M069 Rheumatoid arthritis, unspecified: Secondary | ICD-10-CM | POA: Diagnosis not present

## 2019-12-05 DIAGNOSIS — R634 Abnormal weight loss: Secondary | ICD-10-CM | POA: Diagnosis not present

## 2020-01-12 DIAGNOSIS — M069 Rheumatoid arthritis, unspecified: Secondary | ICD-10-CM | POA: Diagnosis not present

## 2020-01-12 DIAGNOSIS — F0281 Dementia in other diseases classified elsewhere with behavioral disturbance: Secondary | ICD-10-CM | POA: Diagnosis not present

## 2020-01-12 DIAGNOSIS — I700 Atherosclerosis of aorta: Secondary | ICD-10-CM | POA: Diagnosis not present

## 2020-01-12 DIAGNOSIS — K219 Gastro-esophageal reflux disease without esophagitis: Secondary | ICD-10-CM | POA: Diagnosis not present

## 2020-01-12 DIAGNOSIS — I4891 Unspecified atrial fibrillation: Secondary | ICD-10-CM | POA: Diagnosis not present

## 2020-01-12 DIAGNOSIS — Z6820 Body mass index (BMI) 20.0-20.9, adult: Secondary | ICD-10-CM | POA: Diagnosis not present

## 2020-01-12 DIAGNOSIS — J302 Other seasonal allergic rhinitis: Secondary | ICD-10-CM | POA: Diagnosis not present

## 2020-01-12 DIAGNOSIS — Z87891 Personal history of nicotine dependence: Secondary | ICD-10-CM | POA: Diagnosis not present

## 2020-01-12 DIAGNOSIS — J449 Chronic obstructive pulmonary disease, unspecified: Secondary | ICD-10-CM | POA: Diagnosis not present

## 2020-01-12 DIAGNOSIS — I10 Essential (primary) hypertension: Secondary | ICD-10-CM | POA: Diagnosis not present

## 2020-01-12 DIAGNOSIS — R634 Abnormal weight loss: Secondary | ICD-10-CM | POA: Diagnosis not present

## 2020-01-12 DIAGNOSIS — G309 Alzheimer's disease, unspecified: Secondary | ICD-10-CM | POA: Diagnosis not present

## 2020-01-12 DIAGNOSIS — K922 Gastrointestinal hemorrhage, unspecified: Secondary | ICD-10-CM | POA: Diagnosis not present

## 2020-01-16 DIAGNOSIS — J449 Chronic obstructive pulmonary disease, unspecified: Secondary | ICD-10-CM | POA: Diagnosis not present

## 2020-01-16 DIAGNOSIS — G309 Alzheimer's disease, unspecified: Secondary | ICD-10-CM | POA: Diagnosis not present

## 2020-01-16 DIAGNOSIS — F0281 Dementia in other diseases classified elsewhere with behavioral disturbance: Secondary | ICD-10-CM | POA: Diagnosis not present

## 2020-01-16 DIAGNOSIS — M069 Rheumatoid arthritis, unspecified: Secondary | ICD-10-CM | POA: Diagnosis not present

## 2020-01-16 DIAGNOSIS — I700 Atherosclerosis of aorta: Secondary | ICD-10-CM | POA: Diagnosis not present

## 2020-01-16 DIAGNOSIS — R634 Abnormal weight loss: Secondary | ICD-10-CM | POA: Diagnosis not present

## 2020-01-27 DIAGNOSIS — F0281 Dementia in other diseases classified elsewhere with behavioral disturbance: Secondary | ICD-10-CM | POA: Diagnosis not present

## 2020-01-27 DIAGNOSIS — G309 Alzheimer's disease, unspecified: Secondary | ICD-10-CM | POA: Diagnosis not present

## 2020-01-27 DIAGNOSIS — R634 Abnormal weight loss: Secondary | ICD-10-CM | POA: Diagnosis not present

## 2020-01-27 DIAGNOSIS — I700 Atherosclerosis of aorta: Secondary | ICD-10-CM | POA: Diagnosis not present

## 2020-01-27 DIAGNOSIS — M069 Rheumatoid arthritis, unspecified: Secondary | ICD-10-CM | POA: Diagnosis not present

## 2020-01-27 DIAGNOSIS — J449 Chronic obstructive pulmonary disease, unspecified: Secondary | ICD-10-CM | POA: Diagnosis not present

## 2020-01-30 DIAGNOSIS — G309 Alzheimer's disease, unspecified: Secondary | ICD-10-CM | POA: Diagnosis not present

## 2020-01-30 DIAGNOSIS — J449 Chronic obstructive pulmonary disease, unspecified: Secondary | ICD-10-CM | POA: Diagnosis not present

## 2020-01-30 DIAGNOSIS — F0281 Dementia in other diseases classified elsewhere with behavioral disturbance: Secondary | ICD-10-CM | POA: Diagnosis not present

## 2020-01-30 DIAGNOSIS — I700 Atherosclerosis of aorta: Secondary | ICD-10-CM | POA: Diagnosis not present

## 2020-01-30 DIAGNOSIS — M069 Rheumatoid arthritis, unspecified: Secondary | ICD-10-CM | POA: Diagnosis not present

## 2020-01-30 DIAGNOSIS — R634 Abnormal weight loss: Secondary | ICD-10-CM | POA: Diagnosis not present

## 2020-02-06 DIAGNOSIS — G309 Alzheimer's disease, unspecified: Secondary | ICD-10-CM | POA: Diagnosis not present

## 2020-02-06 DIAGNOSIS — M069 Rheumatoid arthritis, unspecified: Secondary | ICD-10-CM | POA: Diagnosis not present

## 2020-02-06 DIAGNOSIS — I700 Atherosclerosis of aorta: Secondary | ICD-10-CM | POA: Diagnosis not present

## 2020-02-06 DIAGNOSIS — F0281 Dementia in other diseases classified elsewhere with behavioral disturbance: Secondary | ICD-10-CM | POA: Diagnosis not present

## 2020-02-06 DIAGNOSIS — J449 Chronic obstructive pulmonary disease, unspecified: Secondary | ICD-10-CM | POA: Diagnosis not present

## 2020-02-06 DIAGNOSIS — R634 Abnormal weight loss: Secondary | ICD-10-CM | POA: Diagnosis not present

## 2020-02-12 DIAGNOSIS — R634 Abnormal weight loss: Secondary | ICD-10-CM | POA: Diagnosis not present

## 2020-02-12 DIAGNOSIS — Z87891 Personal history of nicotine dependence: Secondary | ICD-10-CM | POA: Diagnosis not present

## 2020-02-12 DIAGNOSIS — G309 Alzheimer's disease, unspecified: Secondary | ICD-10-CM | POA: Diagnosis not present

## 2020-02-12 DIAGNOSIS — J302 Other seasonal allergic rhinitis: Secondary | ICD-10-CM | POA: Diagnosis not present

## 2020-02-12 DIAGNOSIS — F0281 Dementia in other diseases classified elsewhere with behavioral disturbance: Secondary | ICD-10-CM | POA: Diagnosis not present

## 2020-02-12 DIAGNOSIS — J449 Chronic obstructive pulmonary disease, unspecified: Secondary | ICD-10-CM | POA: Diagnosis not present

## 2020-02-12 DIAGNOSIS — Z6820 Body mass index (BMI) 20.0-20.9, adult: Secondary | ICD-10-CM | POA: Diagnosis not present

## 2020-02-12 DIAGNOSIS — I10 Essential (primary) hypertension: Secondary | ICD-10-CM | POA: Diagnosis not present

## 2020-02-12 DIAGNOSIS — M069 Rheumatoid arthritis, unspecified: Secondary | ICD-10-CM | POA: Diagnosis not present

## 2020-02-12 DIAGNOSIS — K219 Gastro-esophageal reflux disease without esophagitis: Secondary | ICD-10-CM | POA: Diagnosis not present

## 2020-02-12 DIAGNOSIS — I4891 Unspecified atrial fibrillation: Secondary | ICD-10-CM | POA: Diagnosis not present

## 2020-02-12 DIAGNOSIS — I700 Atherosclerosis of aorta: Secondary | ICD-10-CM | POA: Diagnosis not present

## 2020-02-12 DIAGNOSIS — K922 Gastrointestinal hemorrhage, unspecified: Secondary | ICD-10-CM | POA: Diagnosis not present

## 2020-02-21 DIAGNOSIS — J449 Chronic obstructive pulmonary disease, unspecified: Secondary | ICD-10-CM | POA: Diagnosis not present

## 2020-02-21 DIAGNOSIS — M069 Rheumatoid arthritis, unspecified: Secondary | ICD-10-CM | POA: Diagnosis not present

## 2020-02-21 DIAGNOSIS — I700 Atherosclerosis of aorta: Secondary | ICD-10-CM | POA: Diagnosis not present

## 2020-02-21 DIAGNOSIS — G309 Alzheimer's disease, unspecified: Secondary | ICD-10-CM | POA: Diagnosis not present

## 2020-02-21 DIAGNOSIS — R634 Abnormal weight loss: Secondary | ICD-10-CM | POA: Diagnosis not present

## 2020-02-21 DIAGNOSIS — F0281 Dementia in other diseases classified elsewhere with behavioral disturbance: Secondary | ICD-10-CM | POA: Diagnosis not present

## 2020-02-22 DIAGNOSIS — R634 Abnormal weight loss: Secondary | ICD-10-CM | POA: Diagnosis not present

## 2020-02-22 DIAGNOSIS — I700 Atherosclerosis of aorta: Secondary | ICD-10-CM | POA: Diagnosis not present

## 2020-02-22 DIAGNOSIS — F0281 Dementia in other diseases classified elsewhere with behavioral disturbance: Secondary | ICD-10-CM | POA: Diagnosis not present

## 2020-02-22 DIAGNOSIS — G309 Alzheimer's disease, unspecified: Secondary | ICD-10-CM | POA: Diagnosis not present

## 2020-02-22 DIAGNOSIS — M069 Rheumatoid arthritis, unspecified: Secondary | ICD-10-CM | POA: Diagnosis not present

## 2020-02-22 DIAGNOSIS — J449 Chronic obstructive pulmonary disease, unspecified: Secondary | ICD-10-CM | POA: Diagnosis not present

## 2020-03-06 DIAGNOSIS — I700 Atherosclerosis of aorta: Secondary | ICD-10-CM | POA: Diagnosis not present

## 2020-03-06 DIAGNOSIS — M069 Rheumatoid arthritis, unspecified: Secondary | ICD-10-CM | POA: Diagnosis not present

## 2020-03-06 DIAGNOSIS — G309 Alzheimer's disease, unspecified: Secondary | ICD-10-CM | POA: Diagnosis not present

## 2020-03-06 DIAGNOSIS — R634 Abnormal weight loss: Secondary | ICD-10-CM | POA: Diagnosis not present

## 2020-03-06 DIAGNOSIS — J449 Chronic obstructive pulmonary disease, unspecified: Secondary | ICD-10-CM | POA: Diagnosis not present

## 2020-03-06 DIAGNOSIS — F0281 Dementia in other diseases classified elsewhere with behavioral disturbance: Secondary | ICD-10-CM | POA: Diagnosis not present

## 2020-03-12 DIAGNOSIS — I700 Atherosclerosis of aorta: Secondary | ICD-10-CM | POA: Diagnosis not present

## 2020-03-12 DIAGNOSIS — R634 Abnormal weight loss: Secondary | ICD-10-CM | POA: Diagnosis not present

## 2020-03-12 DIAGNOSIS — G309 Alzheimer's disease, unspecified: Secondary | ICD-10-CM | POA: Diagnosis not present

## 2020-03-12 DIAGNOSIS — J449 Chronic obstructive pulmonary disease, unspecified: Secondary | ICD-10-CM | POA: Diagnosis not present

## 2020-03-12 DIAGNOSIS — F0281 Dementia in other diseases classified elsewhere with behavioral disturbance: Secondary | ICD-10-CM | POA: Diagnosis not present

## 2020-03-12 DIAGNOSIS — M069 Rheumatoid arthritis, unspecified: Secondary | ICD-10-CM | POA: Diagnosis not present

## 2020-03-14 DIAGNOSIS — J449 Chronic obstructive pulmonary disease, unspecified: Secondary | ICD-10-CM | POA: Diagnosis not present

## 2020-03-14 DIAGNOSIS — G309 Alzheimer's disease, unspecified: Secondary | ICD-10-CM | POA: Diagnosis not present

## 2020-03-14 DIAGNOSIS — Z6820 Body mass index (BMI) 20.0-20.9, adult: Secondary | ICD-10-CM | POA: Diagnosis not present

## 2020-03-14 DIAGNOSIS — I4891 Unspecified atrial fibrillation: Secondary | ICD-10-CM | POA: Diagnosis not present

## 2020-03-14 DIAGNOSIS — K922 Gastrointestinal hemorrhage, unspecified: Secondary | ICD-10-CM | POA: Diagnosis not present

## 2020-03-14 DIAGNOSIS — I10 Essential (primary) hypertension: Secondary | ICD-10-CM | POA: Diagnosis not present

## 2020-03-14 DIAGNOSIS — F0281 Dementia in other diseases classified elsewhere with behavioral disturbance: Secondary | ICD-10-CM | POA: Diagnosis not present

## 2020-03-14 DIAGNOSIS — Z87891 Personal history of nicotine dependence: Secondary | ICD-10-CM | POA: Diagnosis not present

## 2020-03-14 DIAGNOSIS — R634 Abnormal weight loss: Secondary | ICD-10-CM | POA: Diagnosis not present

## 2020-03-14 DIAGNOSIS — I700 Atherosclerosis of aorta: Secondary | ICD-10-CM | POA: Diagnosis not present

## 2020-03-14 DIAGNOSIS — J302 Other seasonal allergic rhinitis: Secondary | ICD-10-CM | POA: Diagnosis not present

## 2020-03-14 DIAGNOSIS — K219 Gastro-esophageal reflux disease without esophagitis: Secondary | ICD-10-CM | POA: Diagnosis not present

## 2020-03-14 DIAGNOSIS — M069 Rheumatoid arthritis, unspecified: Secondary | ICD-10-CM | POA: Diagnosis not present

## 2020-03-15 DIAGNOSIS — M069 Rheumatoid arthritis, unspecified: Secondary | ICD-10-CM | POA: Diagnosis not present

## 2020-03-15 DIAGNOSIS — F0281 Dementia in other diseases classified elsewhere with behavioral disturbance: Secondary | ICD-10-CM | POA: Diagnosis not present

## 2020-03-15 DIAGNOSIS — R634 Abnormal weight loss: Secondary | ICD-10-CM | POA: Diagnosis not present

## 2020-03-15 DIAGNOSIS — I700 Atherosclerosis of aorta: Secondary | ICD-10-CM | POA: Diagnosis not present

## 2020-03-15 DIAGNOSIS — J449 Chronic obstructive pulmonary disease, unspecified: Secondary | ICD-10-CM | POA: Diagnosis not present

## 2020-03-15 DIAGNOSIS — G309 Alzheimer's disease, unspecified: Secondary | ICD-10-CM | POA: Diagnosis not present
